# Patient Record
Sex: Male | Born: 1960 | Race: White | Hispanic: No | Marital: Married | Smoking: Never smoker
Health system: Southern US, Community
[De-identification: ages and names within clinical notes are randomized; demographics above are authoritative.]

## PROBLEM LIST (undated history)

## (undated) DIAGNOSIS — S83232A Complex tear of medial meniscus, current injury, left knee, initial encounter: Secondary | ICD-10-CM

## (undated) DIAGNOSIS — M1990 Unspecified osteoarthritis, unspecified site: Secondary | ICD-10-CM

## (undated) DIAGNOSIS — Z9841 Cataract extraction status, right eye: Secondary | ICD-10-CM

## (undated) DIAGNOSIS — Z9842 Cataract extraction status, left eye: Secondary | ICD-10-CM

## (undated) DIAGNOSIS — I350 Nonrheumatic aortic (valve) stenosis: Secondary | ICD-10-CM

## (undated) DIAGNOSIS — I5189 Other ill-defined heart diseases: Secondary | ICD-10-CM

## (undated) DIAGNOSIS — I10 Essential (primary) hypertension: Secondary | ICD-10-CM

## (undated) DIAGNOSIS — M109 Gout, unspecified: Secondary | ICD-10-CM

## (undated) DIAGNOSIS — K219 Gastro-esophageal reflux disease without esophagitis: Secondary | ICD-10-CM

## (undated) DIAGNOSIS — E785 Hyperlipidemia, unspecified: Secondary | ICD-10-CM

## (undated) DIAGNOSIS — R011 Cardiac murmur, unspecified: Secondary | ICD-10-CM

## (undated) DIAGNOSIS — I4510 Unspecified right bundle-branch block: Secondary | ICD-10-CM

## (undated) HISTORY — PX: COLONOSCOPY: SHX174

## (undated) HISTORY — PX: UPPER GI ENDOSCOPY: SHX6162

---

## 2008-02-21 ENCOUNTER — Other Ambulatory Visit: Payer: Self-pay

## 2008-02-21 ENCOUNTER — Emergency Department: Payer: Self-pay | Admitting: Emergency Medicine

## 2009-11-01 ENCOUNTER — Emergency Department: Payer: Self-pay | Admitting: Emergency Medicine

## 2011-03-09 ENCOUNTER — Ambulatory Visit: Payer: Self-pay | Admitting: Emergency Medicine

## 2011-12-16 ENCOUNTER — Emergency Department: Payer: Self-pay | Admitting: *Deleted

## 2013-03-02 IMAGING — CR DG CHEST 2V
1 series · 2 of 2 positions shown · non-contrast
Comparison: none

REASON FOR EXAM: chest pain
COMMENTS:

PROCEDURE:     DXR - DXR CHEST PA (OR AP) AND LATERAL  - March 09, 2011 [DATE]
RESULT:     Comparison is made to the prior exam 02/21/2008. The lung fields
are clear. The heart, mediastinal and osseous structures show no significant
abnormalities.

[Series 1: view not recorded · 0.17mm/px · 2 of 2 slices shown]
[im 1/2]
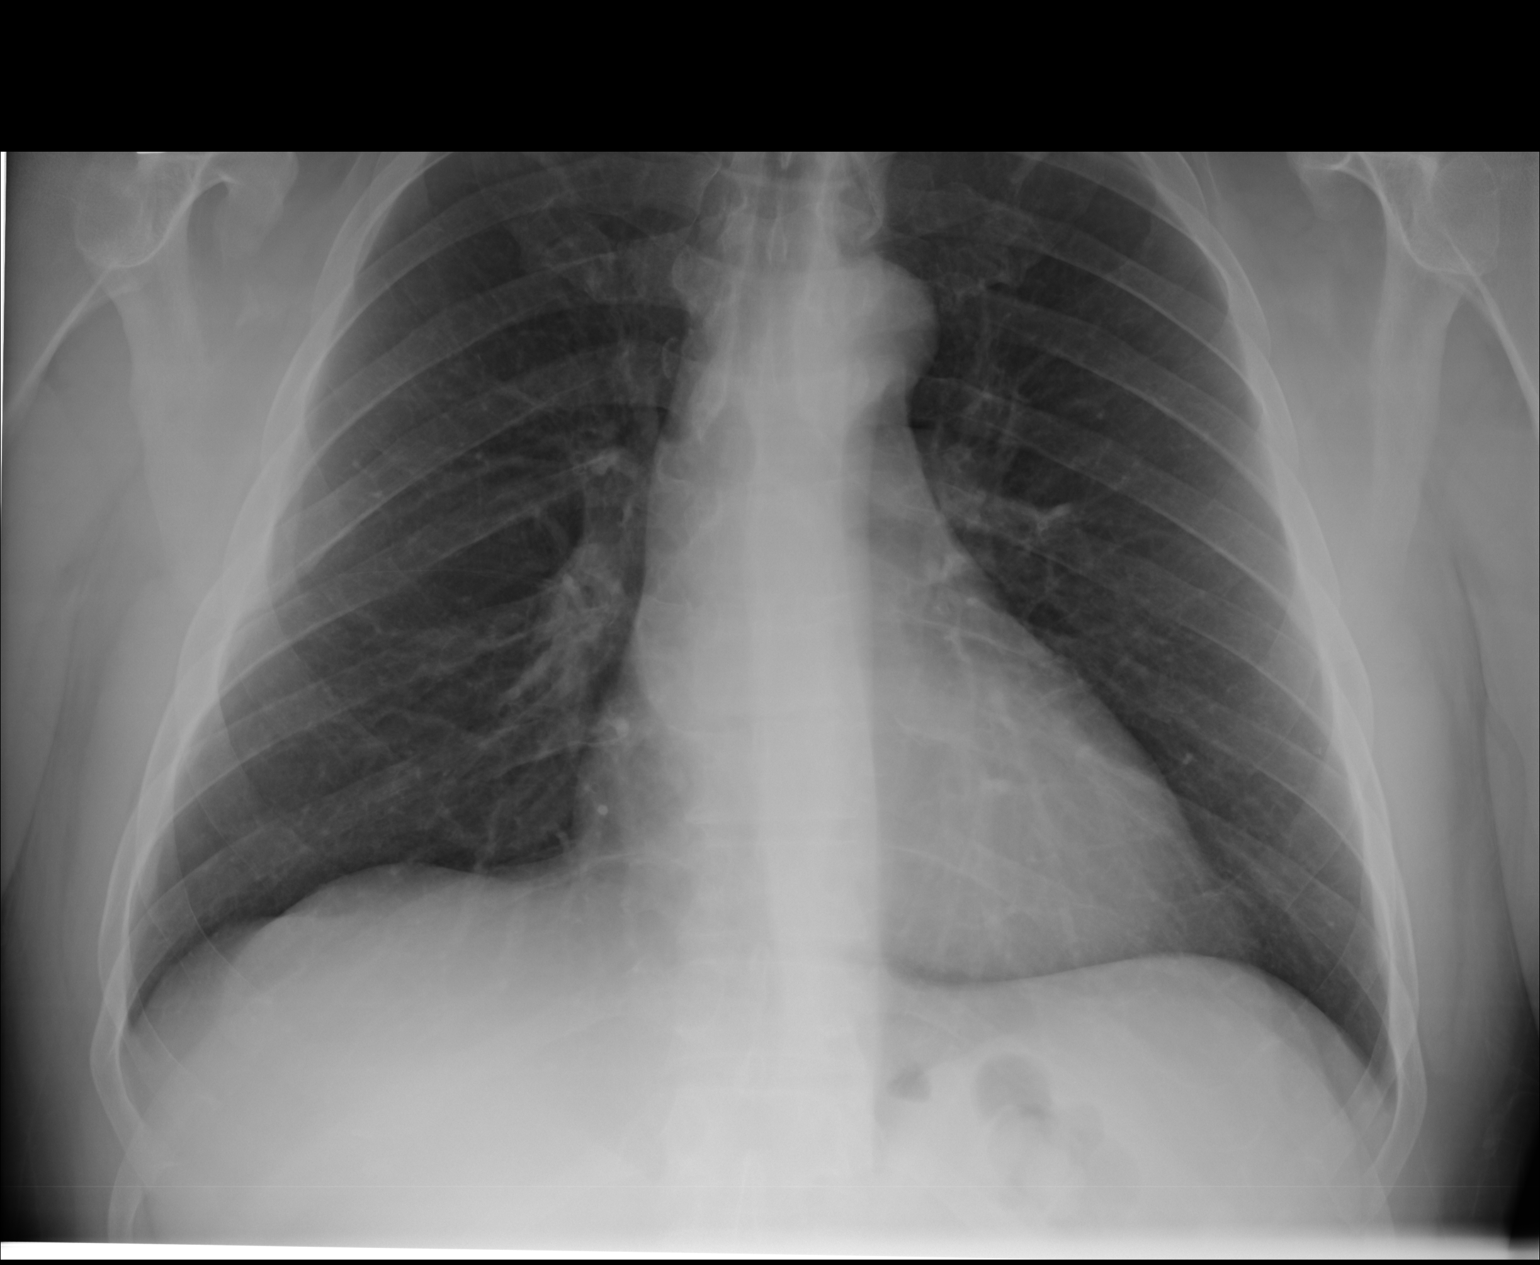
[im 2/2]
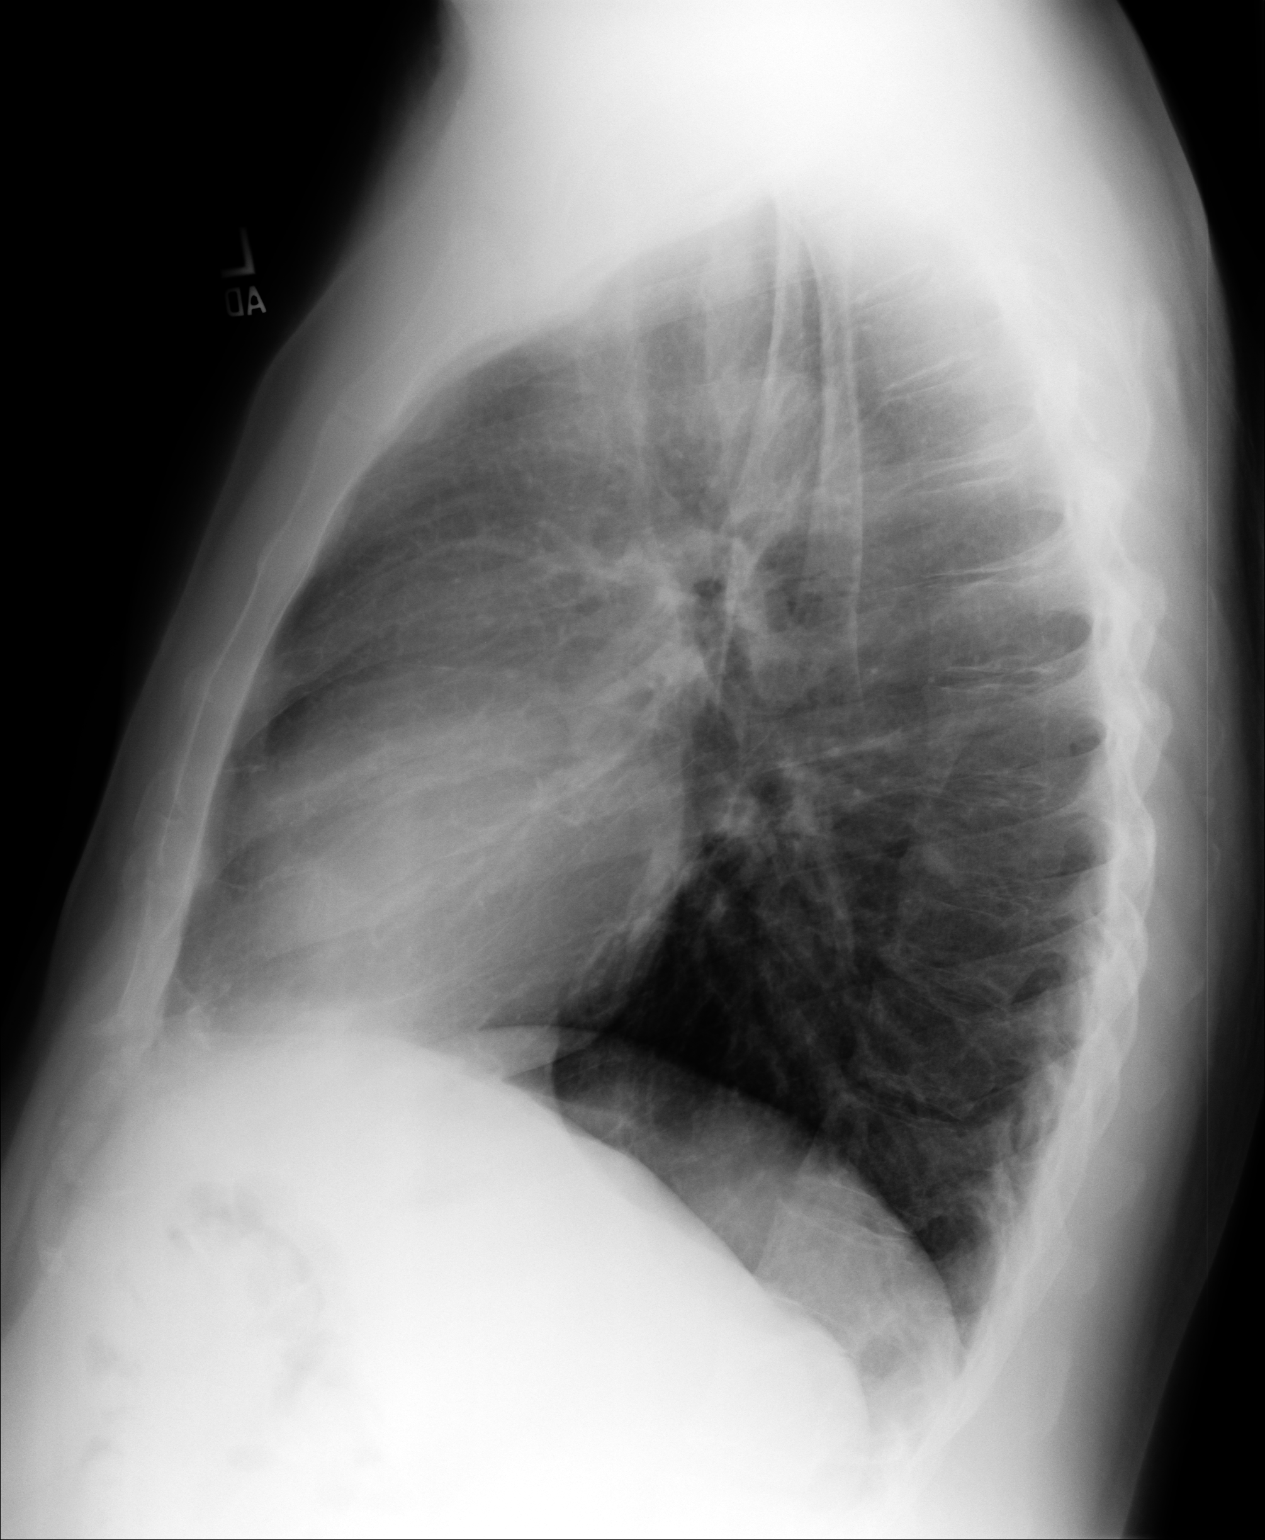

[2 of 2 positions shown; findings below may reference images not displayed]

IMPRESSION: No significant abnormalities are noted.

## 2013-05-20 ENCOUNTER — Emergency Department: Payer: Self-pay | Admitting: Emergency Medicine

## 2014-06-18 IMAGING — CR DG HAND COMPLETE 3+V*L*
1 series · 3 of 3 positions shown · non-contrast
Comparison: none

REASON FOR EXAM: injury
COMMENTS:   LMP: (Male)

[Series 1: x hand pa left · 0.14mm/px · 3 of 3 slices shown]
[im 1/3]
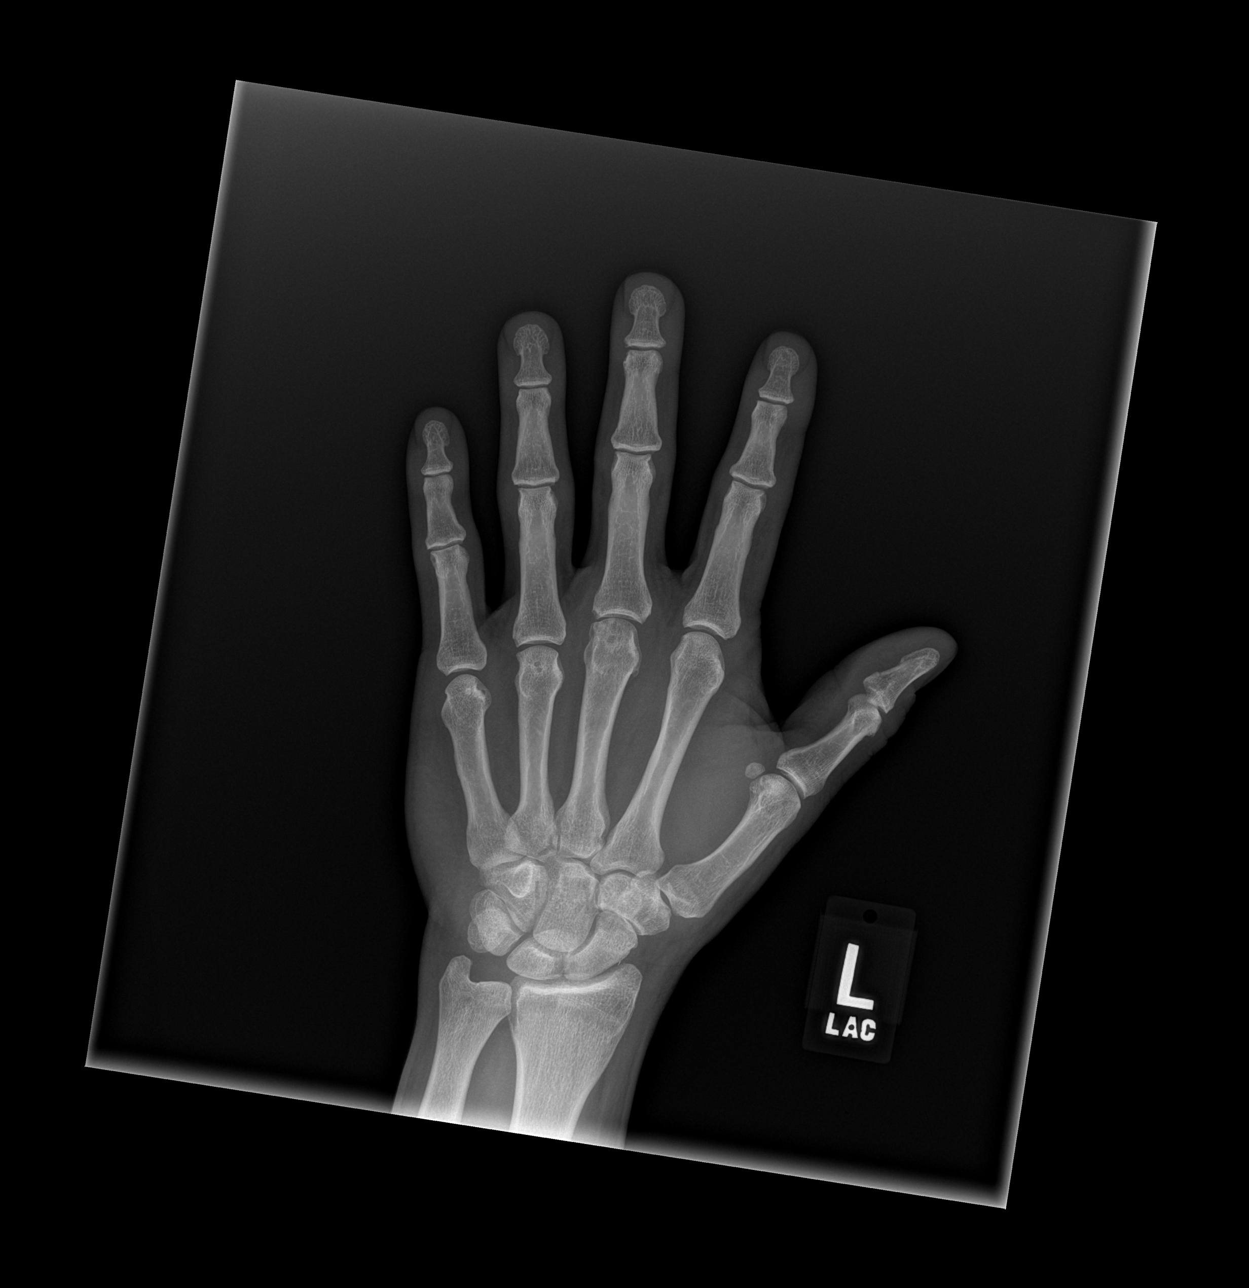
[im 2/3]
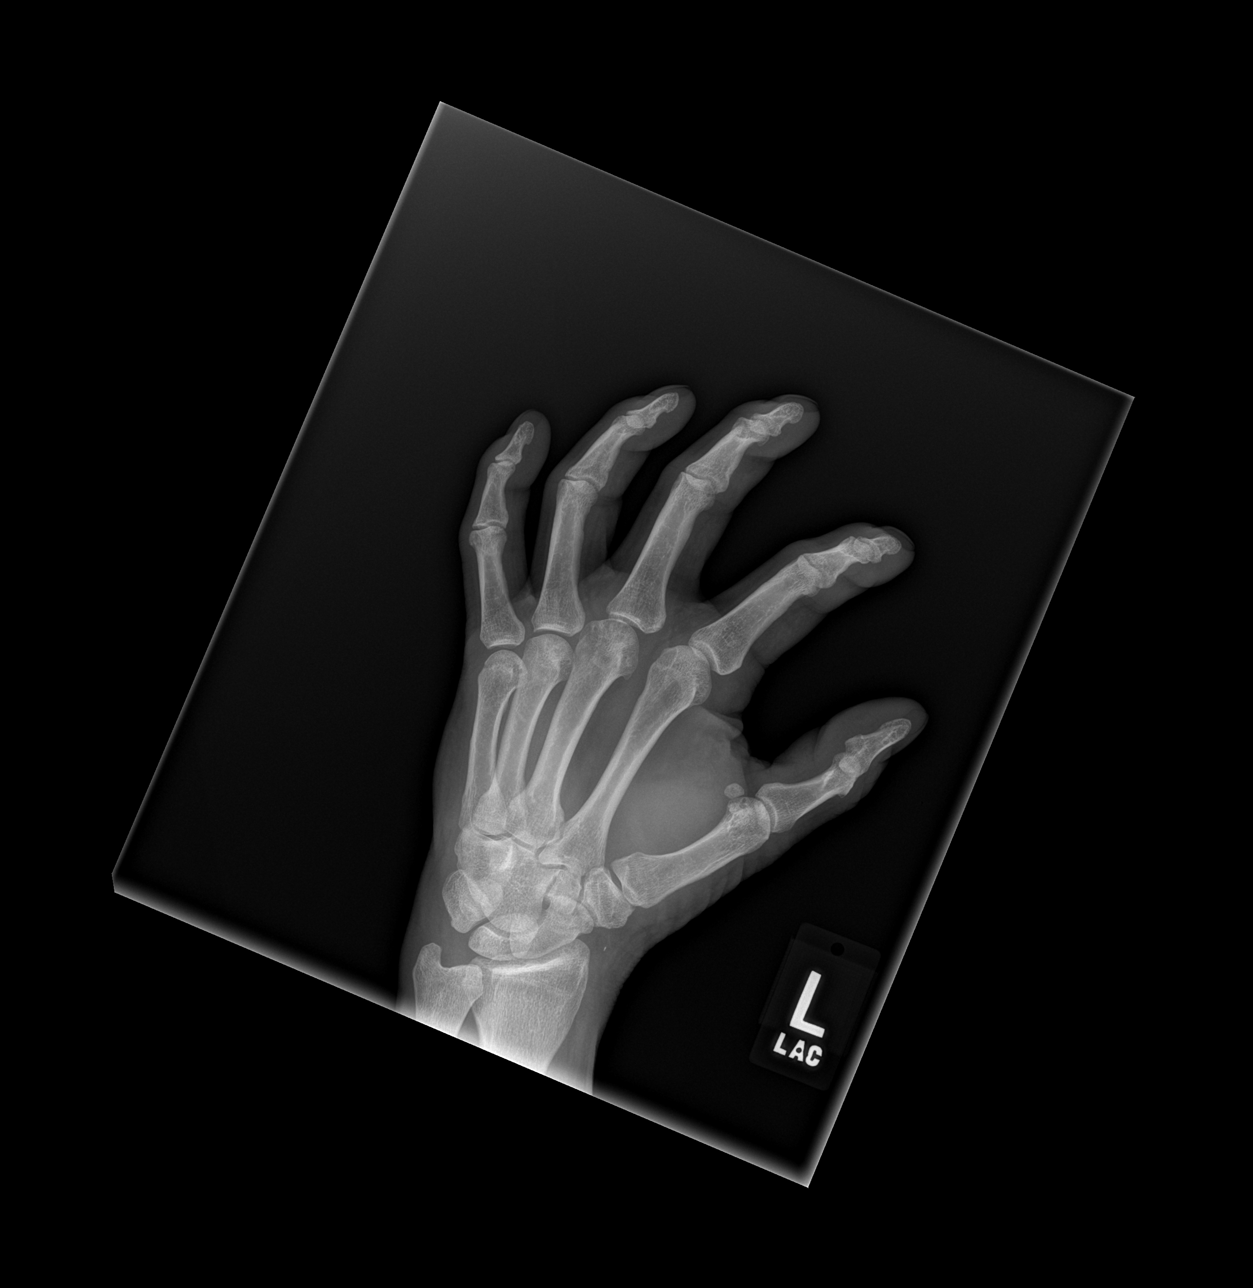
[im 3/3]
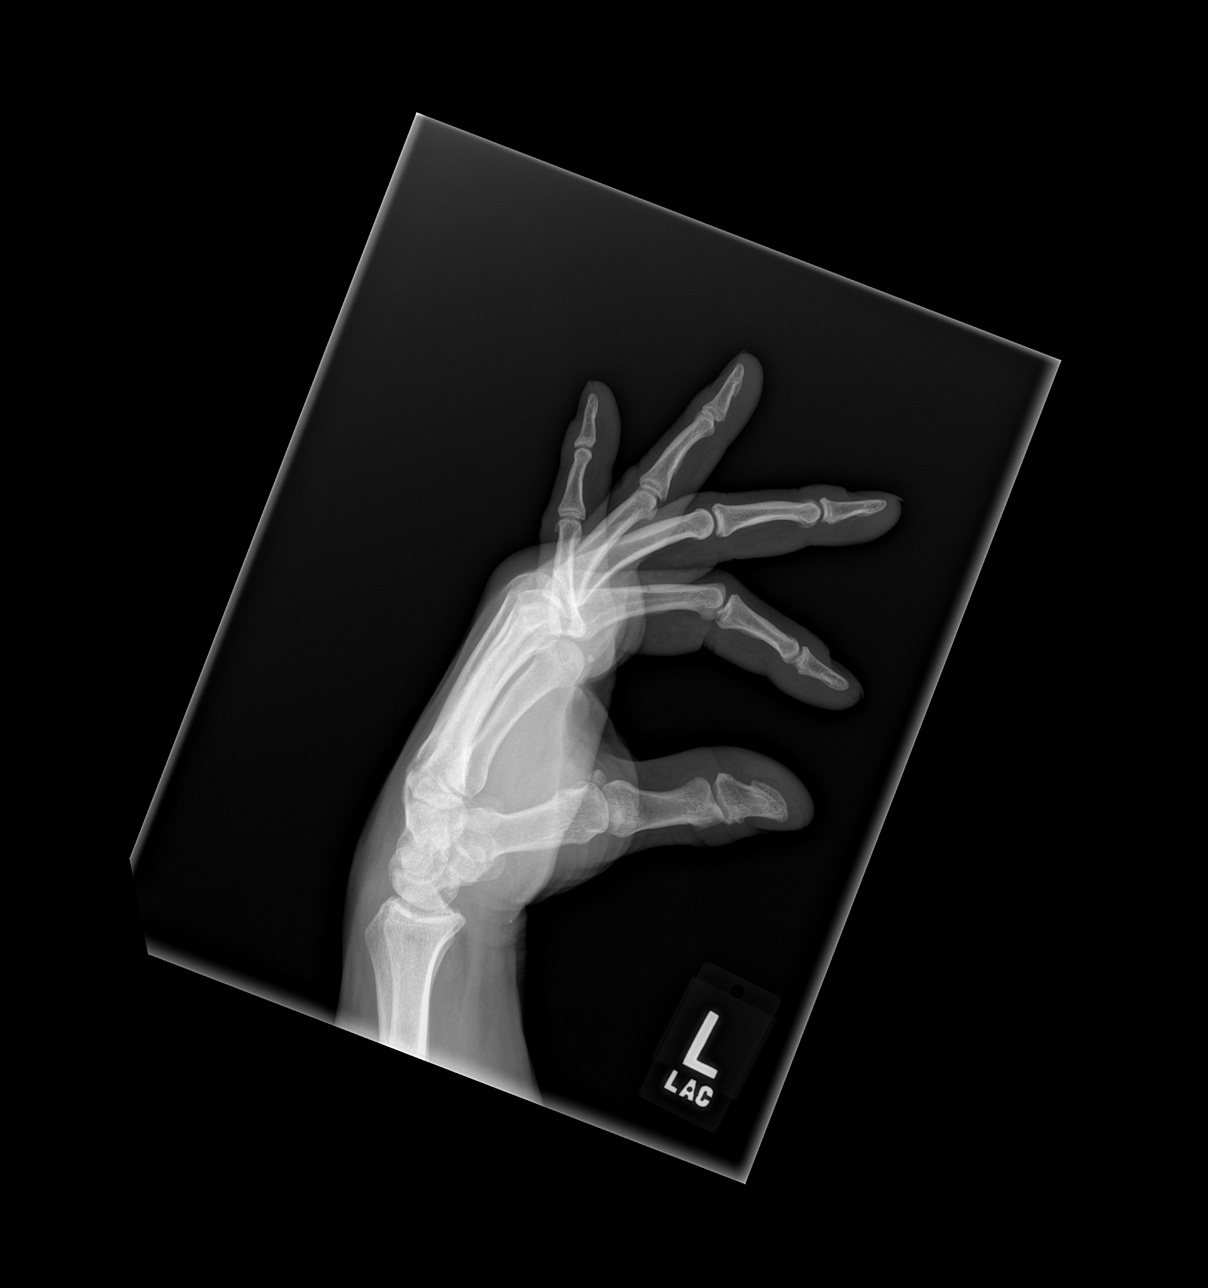

[3 of 3 positions shown; findings below may reference images not displayed]

PROCEDURE:     DXR - DXR HAND LT COMPLETE  W/OBLIQUES  - December 16, 2011  [DATE]

RESULT:     Three views of the left hand reveal the bones to be adequately
mineralized. I do not see evidence of an acute fracture nor dislocation.
There is no periosteal reaction. There is no evidence of soft tissue gas.
Mild soft tissue swelling is noted dorsally over the metacarpal regions on
the lateral film.
IMPRESSION: I do not see acute bony abnormality of the left hand. If
the patient's symptoms persist and remain unexplained, further evaluation
with nuclear bone scanning or with MRI may be useful.

[REDACTED]

## 2015-07-16 ENCOUNTER — Emergency Department
Admission: EM | Admit: 2015-07-16 | Discharge: 2015-07-16 | Disposition: A | Discharge status: Home / Self Care | Payer: Self-pay

## 2017-12-31 ENCOUNTER — Encounter: Payer: Self-pay | Admitting: Emergency Medicine

## 2017-12-31 ENCOUNTER — Emergency Department
Admission: EM | Admit: 2017-12-31 | Discharge: 2017-12-31 | Disposition: A | Discharge status: Home / Self Care | Payer: Self-pay | Attending: Emergency Medicine | Admitting: Emergency Medicine

## 2017-12-31 DIAGNOSIS — T18128A Food in esophagus causing other injury, initial encounter: Secondary | ICD-10-CM | POA: Insufficient documentation

## 2017-12-31 DIAGNOSIS — Y929 Unspecified place or not applicable: Secondary | ICD-10-CM | POA: Insufficient documentation

## 2017-12-31 DIAGNOSIS — Y999 Unspecified external cause status: Secondary | ICD-10-CM | POA: Insufficient documentation

## 2017-12-31 DIAGNOSIS — Y9389 Activity, other specified: Secondary | ICD-10-CM | POA: Insufficient documentation

## 2017-12-31 DIAGNOSIS — X58XXXA Exposure to other specified factors, initial encounter: Secondary | ICD-10-CM | POA: Insufficient documentation

## 2017-12-31 DIAGNOSIS — Z87891 Personal history of nicotine dependence: Secondary | ICD-10-CM | POA: Insufficient documentation

## 2017-12-31 DIAGNOSIS — K222 Esophageal obstruction: Secondary | ICD-10-CM | POA: Insufficient documentation

## 2017-12-31 DIAGNOSIS — Z79899 Other long term (current) drug therapy: Secondary | ICD-10-CM | POA: Insufficient documentation

## 2017-12-31 DIAGNOSIS — I10 Essential (primary) hypertension: Secondary | ICD-10-CM | POA: Insufficient documentation

## 2017-12-31 HISTORY — DX: Essential (primary) hypertension: I10

## 2017-12-31 MED ORDER — GLUCAGON HCL (RDNA) 1 MG IJ SOLR
1.0000 mg | Freq: Once | INTRAMUSCULAR | Status: AC
  Administered 2017-12-31: 1 mg via INTRAVENOUS
  Filled 2017-12-31 (×2): qty 1

## 2017-12-31 MED ORDER — AMLODIPINE BESYLATE 5 MG PO TABS: 5.0000 mg | ORAL_TABLET | Freq: Every day | ORAL | 1 refills | Status: DC

## 2017-12-31 MED ORDER — ONDANSETRON HCL 4 MG/2ML IJ SOLN
4.0000 mg | Freq: Once | INTRAMUSCULAR | Status: AC
  Administered 2017-12-31: 4 mg via INTRAVENOUS
  Filled 2017-12-31: qty 2

## 2017-12-31 MED ORDER — OMEPRAZOLE 40 MG PO CPDR: 40.0000 mg | DELAYED_RELEASE_CAPSULE | Freq: Every day | ORAL | 1 refills | Status: DC

## 2017-12-31 NOTE — ED Provider Notes (Signed)
[facility] Emergency Department Provider Note  ____________________________________________  Time seen: Approximately 10:10 AM  I have reviewed the triage vital signs and the nursing notes.   HISTORY  Chief Complaint Choking and Dysphagia   HPI Anthony Olsen is a 57 y.o. male with a history of hypertension who presents for evaluation of esophageal obstruction due to food bolus.  Patient reports that yesterday evening he had a steak.  He feels that the steak did not go down and still feeling pressure in the lower part of his esophagus.  The symptoms were sudden and acute in onset and constant since last night. Since last night has been trying to drink water and has vomited every time.  He is able to handle his saliva.  He denies shortness of breath, chest pain, abdominal pain.  Patient had 2 prior episodes of food impaction 1 which resolved with glucagon and the other one required an endoscopy.  Patient does not take any medications for GERD.  He he also reports noncompliance with his antihypertensive medication for several months.  He does not have a primary care doctor and ran out of prescriptions.  Past Medical History:  Diagnosis Date  . Hypertension      Prior to Admission medications   Medication Sig Start Date End Date Taking? Authorizing Provider  naproxen sodium (ALEVE) 220 MG tablet Take 220-440 mg by mouth daily as needed (arthritis pain).   Yes [provider]  amLODipine (NORVASC) 5 MG tablet Take 1 tablet (5 mg total) by mouth daily. 12/31/17 12/31/18  Nita Sickle, MD  omeprazole (PRILOSEC) 40 MG capsule Take 1 capsule (40 mg total) by mouth daily. 12/31/17 12/31/18  Nita Sickle, MD    Allergies Patient has no known allergies.  FH COPD Brother    Coronary Artery Disease (Blocked arteries around heart) Father    High blood pressure (Hypertension) Father    Breast cancer Mother      Social History Social  History   Tobacco Use  . Smoking status: Former Games developer  . Smokeless tobacco: Never Used  Substance Use Topics  . Alcohol use: Yes  . Drug use: Not on file    Review of Systems  Constitutional: Negative for fever. Eyes: Negative for visual changes. ENT: Negative for sore throat. Neck: No neck pain  Cardiovascular: Negative for chest pain. Respiratory: Negative for shortness of breath. Gastrointestinal: Negative for abdominal pain. + vomiting Genitourinary: Negative for dysuria. Musculoskeletal: Negative for back pain. Skin: Negative for rash. Neurological: Negative for headaches, weakness or numbness. Psych: No SI or HI  ____________________________________________   PHYSICAL EXAM:  VITAL SIGNS: ED Triage Vitals  Enc Vitals Group     BP 12/31/17 0832 (!) 161/86     Pulse Rate 12/31/17 0832 82     Resp 12/31/17 0832 20     Temp 12/31/17 0832 98.4 F (36.9 C)     Temp Source 12/31/17 0832 Oral     SpO2 12/31/17 0832 94 %     Weight 12/31/17 0833 240 lb (108.9 kg)     Height 12/31/17 0833 6\' 2"  (1.88 m)     Head Circumference --      Peak Flow --      Pain Score 12/31/17 0842 0     Pain Loc --      Pain Edu? --      Excl. in GC? --     Constitutional: Alert and oriented. Well appearing and in no apparent distress.  HEENT:      Head: Normocephalic and atraumatic.         Eyes: Conjunctivae are normal. Sclera is non-icteric.       Mouth/Throat: Mucous membranes are moist.       Neck: Supple with no signs of meningismus. Cardiovascular: Regular rate and rhythm. No murmurs, gallops, or rubs. 2+ symmetrical distal pulses are present in all extremities. No JVD. Respiratory: Normal respiratory effort. Lungs are clear to auscultation bilaterally. No wheezes, crackles, or rhonchi.  Gastrointestinal: Soft, non tender, and non distended with positive bowel sounds. No rebound or guarding. Musculoskeletal: Nontender with normal range of motion in all extremities. No edema,  cyanosis, or erythema of extremities. Neurologic: Normal speech and language. Face is symmetric. Moving all extremities. No gross focal neurologic deficits are appreciated. Skin: Skin is warm, dry and intact. No rash noted. Psychiatric: Mood and affect are normal. Speech and behavior are normal.  ____________________________________________   LABS (all labs ordered are listed, but only abnormal results are displayed)  Labs Reviewed - No data to display ____________________________________________  EKG  none  __________________________________________  RADIOLOGY  none  ____________________________________________   PROCEDURES  Procedure(s) performed: None Procedures Critical Care performed:  None ____________________________________________   INITIAL IMPRESSION / ASSESSMENT AND PLAN / ED COURSE  57 y.o. male with a history of hypertension who presents for evaluation of esophageal obstruction due to food bolus since last night.  Patient is vomiting every time he tries to drink anything.  He is handling his saliva, no respiratory distress.  Patient was given IV Zofran and glucagon and 45 minutes later reports that he felt the fluid bolus moving down and he was able to tolerate liquids and solids without any further vomiting.  Will start patient on omeprazole for GERD.  Patient is requesting prescription for his antihypertensive medication which she has been out of for several months.  Patient was supposed to be on lisinopril however due possible side effects of angioedema I have suggested switching patient to amlodipine which he accepted.  Will provide him with a prescription for that.  [facility].  Discussed return precautions for any further episodes of food impaction, difficulty breathing, vomiting, or abdominal pain.  At this time patient is stable for discharge.      As part of my medical decision making, I reviewed the  following data within the electronic MEDICAL RECORD NUMBER Nursing notes reviewed and incorporated, Old chart reviewed, Notes from prior ED visits and [place] Controlled Substance Database    Pertinent labs & imaging results that were available during my care of the patient were reviewed by me and considered in my medical decision making (see chart for details).    ____________________________________________   FINAL CLINICAL IMPRESSION(S) / ED DIAGNOSES  Final diagnoses:  Esophageal obstruction due to food impaction      NEW MEDICATIONS STARTED DURING THIS VISIT:  ED Discharge Orders        Ordered    omeprazole (PRILOSEC) 40 MG capsule  Daily     12/31/17 1117    amLODipine (NORVASC) 5 MG tablet  Daily     12/31/17 1117       Note:  This document was prepared using Dragon voice recognition software and may include unintentional dictation errors.    Don PerkingVeronese, [place], MD 12/31/17 1124

## 2017-12-31 NOTE — ED Notes (Signed)
First Nurse Note: Patient states he has a piece of steak stuck in his esophagus since 2230 last PM.  Has had this previously appox. 1 year ago.  Alert and oriented.  NAD.

## 2017-12-31 NOTE — ED Notes (Signed)
Pt attempted to drink sprite. Pt was unable to keep it down.

## 2017-12-31 NOTE — ED Notes (Signed)
Pt placed on monitor.  Pt states he ate steak last night and felt like the food became stuck in his throat reports he is unable to swallow his own saliva and can not swallow liquids.  Pt appears in no acute resp distress at this time.  Pt alert and oriented with family at bedside.

## 2017-12-31 NOTE — Discharge Instructions (Signed)
Please return to the emergency room if you are having any further difficulties eating, chest pain, abdominal pain.  Otherwise follow-up with your primary care doctor.

## 2017-12-31 NOTE — ED Triage Notes (Signed)
Patient presents to the ED stating, "I've got a piece of steak stuck in my throat."  Patient reports similar occurrence a few times in the past.  Patient states, "I had a glucose shot before and that fixed it.  One time they had to go in and remove it."  Patient states he is having difficulty drinking water.  Patient states he felt like he was choking prior to feeling like food was stuck.

## 2018-06-29 HISTORY — PX: BRONCHOSCOPIC REMOVAL OF FOREIGN BODY: SHX5171

## 2018-10-28 DIAGNOSIS — U071 COVID-19: Secondary | ICD-10-CM

## 2018-10-28 HISTORY — DX: COVID-19: U07.1

## 2018-11-01 ENCOUNTER — Ambulatory Visit: Payer: Self-pay | Admitting: General Practice

## 2018-11-01 NOTE — Telephone Encounter (Signed)
Pt and wife, Anthony Olsen on speaker phone called in.   Pt has been exposed to co-workers who are positive for the COVID-19 virus.   He now has all the symptoms now except difficulty breathing.   See triage notes.  He is not established with a PCP so I referred him to the [facility] ED since that is where they are directing the COVID-19 pts.  I let him know that I would call ahead and let them know you were coming in and give them your name.  Pt and wife were agreeable to this plan.  [facility] and [facility]   I gave her the pt' name and symptoms.   She thanked me for the heads up.    Reason for Disposition . [1] Fever (or feeling feverish) OR cough occurs AND [2] living in area with major community spread AND [3] testing being done in the community for symptoms  Answer Assessment - Initial Assessment Questions 1. CLOSE CONTACT: "Who is the person with the confirmed or suspected COVID-19 infection that you were exposed to?"     He worked with several co-workers who had COVID-19.   Coughing, fever, chills, loss of taste, tired, weakness.   No difficulty breathing.   Had fever.  101.8 at the highest.   He takes Aleve for arthritis so it's bringing the fever down.  Wife is answering questions but he is in the background.. 2. PLACE of CONTACT: "Where were you when you were exposed to COVID-19?" (e.g., home, school, medical waiting room; which city?)     Work. 3. TYPE of CONTACT: "How much contact was there?" (e.g., sitting next to, live in same house, work in same office, same building)     Spoke with a co-worker that has died. 4. DURATION of CONTACT: "How long were you in contact with the COVID-19 patient?" (e.g., a few seconds, passed by person, a few minutes, live with the patient)     They had a company come in and clean the building due to the positive cases of COVID-19.   One co-worker is on the ventilator. 5. DATE of CONTACT: "When did you have contact with a  COVID-19 patient?" (e.g., how many days ago)     Had contact with person that was positive a month ago.   The company is not telling us who it is.   They are monitoring his temperature.   But not put out of work until fever is 104.     101.8 his fever. 6. TRAVEL: "Have you traveled out of the country recently?" If so, "When and where?"     * Also ask about out-of-state travel, since the CDC has identified some high risk cities for community spread in the [place].     * Note: Travel becomes less relevant if there is widespread community transmission where the patient lives.     No travels. 7. COMMUNITY SPREAD: "Are there lots of cases of COVID-19 (community spread) where you live?" (See public health department website, if unsure)   * MAJOR community spread: high number of cases; numbers of cases are increasing; many people hospitalized.   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   8. SYMPTOMS: "Do you have any symptoms?" (e.g., fever, cough, breathing difficulty)     See above. 9. PREGNANCY OR POSTPARTUM: "Is  there any chance you are pregnant?" "When was your last menstrual period?" "Did you deliver in the last 2 weeks?"     N/A 10. HIGH RISK: "Do you have any heart or lung problems? Do you have a weak immune system?" (e.g., CHF, COPD, asthma, HIV positive, chemotherapy, renal failure, diabetes mellitus, sickle cell anemia)       No.  Protocols used: CORONAVIRUS (COVID-19) EXPOSURE-A-AH

## 2018-11-02 DIAGNOSIS — Z8616 Personal history of COVID-19: Secondary | ICD-10-CM

## 2018-11-02 HISTORY — DX: Personal history of COVID-19: Z86.16

## 2019-10-09 ENCOUNTER — Ambulatory Visit: Admit: 2019-10-09 | Payer: Self-pay | Admitting: Ophthalmology

## 2019-10-09 SURGERY — PHACOEMULSIFICATION, CATARACT, WITH IOL INSERTION
Anesthesia: Topical | Laterality: Left

## 2020-02-06 ENCOUNTER — Encounter: Payer: Self-pay | Admitting: Ophthalmology

## 2020-02-06 ENCOUNTER — Other Ambulatory Visit: Payer: Self-pay

## 2020-02-08 ENCOUNTER — Other Ambulatory Visit
Admission: RE | Admit: 2020-02-08 | Discharge: 2020-02-08 | Disposition: A | Discharge status: Home / Self Care | Payer: Managed Care, Other (non HMO) | Source: Ambulatory Visit | Attending: Ophthalmology | Admitting: Ophthalmology

## 2020-02-08 ENCOUNTER — Other Ambulatory Visit: Payer: Self-pay

## 2020-02-08 DIAGNOSIS — Z01812 Encounter for preprocedural laboratory examination: Secondary | ICD-10-CM | POA: Insufficient documentation

## 2020-02-08 DIAGNOSIS — Z20822 Contact with and (suspected) exposure to covid-19: Secondary | ICD-10-CM | POA: Diagnosis not present

## 2020-02-08 LAB — SARS CORONAVIRUS 2 (TAT 6-24 HRS): SARS Coronavirus 2: NEGATIVE

## 2020-02-08 NOTE — Discharge Instructions (Signed)

## 2020-02-09 NOTE — Anesthesia Preprocedure Evaluation (Addendum)
Anesthesia Evaluation  Patient identified by MRN, date of birth, ID band Patient awake    Reviewed: Allergy & Precautions, NPO status , Patient's Chart, lab work & pertinent test results, reviewed documented beta blocker date and time   History of Anesthesia Complications Negative for: history of anesthetic complications  Airway Mallampati: II  TM Distance: >3 FB Neck ROM: Full    Dental   Pulmonary former smoker,    breath sounds clear to auscultation       Cardiovascular hypertension, (-) angina(-) DOE  Rhythm:Regular Rate:Normal     Neuro/Psych    GI/Hepatic neg GERD  ,  Endo/Other    Renal/GU      Musculoskeletal  (+) Arthritis ,   Abdominal (+) + obese (BMI 34),   Peds  Hematology   Anesthesia Other Findings Gout  Reproductive/Obstetrics                            Anesthesia Physical Anesthesia Plan  ASA: II  Anesthesia Plan: MAC   Post-op Pain Management:    Induction: Intravenous  PONV Risk Score and Plan: 1 and TIVA, Midazolam and Treatment may vary due to age or medical condition  Airway Management Planned: Nasal Cannula  Additional Equipment:   Intra-op Plan:   Post-operative Plan:   Informed Consent: I have reviewed the patients History and Physical, chart, labs and discussed the procedure including the risks, benefits and alternatives for the proposed anesthesia with the patient or authorized representative who has indicated his/her understanding and acceptance.       Plan Discussed with: CRNA and Anesthesiologist  Anesthesia Plan Comments:         Anesthesia Quick Evaluation

## 2020-02-12 ENCOUNTER — Other Ambulatory Visit: Payer: Self-pay

## 2020-02-12 ENCOUNTER — Ambulatory Visit: Payer: Managed Care, Other (non HMO) | Admitting: Anesthesiology

## 2020-02-12 ENCOUNTER — Encounter
Admission: RE | Disposition: A | Discharge status: Home / Self Care | Payer: Self-pay | Source: Home / Self Care | Attending: Ophthalmology

## 2020-02-12 ENCOUNTER — Ambulatory Visit
Admission: RE | Admit: 2020-02-12 | Discharge: 2020-02-12 | Disposition: A | Discharge status: Home / Self Care | Payer: Managed Care, Other (non HMO) | Attending: Ophthalmology | Admitting: Ophthalmology

## 2020-02-12 ENCOUNTER — Encounter: Payer: Self-pay | Admitting: Ophthalmology

## 2020-02-12 DIAGNOSIS — Z87891 Personal history of nicotine dependence: Secondary | ICD-10-CM | POA: Diagnosis not present

## 2020-02-12 DIAGNOSIS — Z8616 Personal history of COVID-19: Secondary | ICD-10-CM | POA: Diagnosis not present

## 2020-02-12 DIAGNOSIS — H2512 Age-related nuclear cataract, left eye: Secondary | ICD-10-CM | POA: Insufficient documentation

## 2020-02-12 DIAGNOSIS — I10 Essential (primary) hypertension: Secondary | ICD-10-CM | POA: Insufficient documentation

## 2020-02-12 HISTORY — DX: Unspecified osteoarthritis, unspecified site: M19.90

## 2020-02-12 HISTORY — PX: CATARACT EXTRACTION W/PHACO: SHX586

## 2020-02-12 HISTORY — DX: Gout, unspecified: M10.9

## 2020-02-12 SURGERY — PHACOEMULSIFICATION, CATARACT, WITH IOL INSERTION
Anesthesia: Monitor Anesthesia Care | Site: Eye | Laterality: Left

## 2020-02-12 MED ORDER — FENTANYL CITRATE (PF) 100 MCG/2ML IJ SOLN
INTRAMUSCULAR | Status: DC | PRN
  Administered 2020-02-12: 50 ug via INTRAVENOUS

## 2020-02-12 MED ORDER — LACTATED RINGERS IV SOLN: INTRAVENOUS | Status: DC

## 2020-02-12 MED ORDER — TETRACAINE HCL 0.5 % OP SOLN
1.0000 [drp] | OPHTHALMIC | Status: DC | PRN
  Administered 2020-02-12 (×3): 1 [drp] via OPHTHALMIC

## 2020-02-12 MED ORDER — ACETAMINOPHEN 10 MG/ML IV SOLN: 1000.0000 mg | Freq: Once | INTRAVENOUS | Status: DC | PRN

## 2020-02-12 MED ORDER — ONDANSETRON HCL 4 MG/2ML IJ SOLN: 4.0000 mg | Freq: Once | INTRAMUSCULAR | Status: DC | PRN

## 2020-02-12 MED ORDER — MIDAZOLAM HCL 2 MG/2ML IJ SOLN
INTRAMUSCULAR | Status: DC | PRN
  Administered 2020-02-12: 2 mg via INTRAVENOUS

## 2020-02-12 MED ORDER — LIDOCAINE HCL (PF) 2 % IJ SOLN
INTRAOCULAR | Status: DC | PRN
  Administered 2020-02-12: 1 mL via INTRAOCULAR

## 2020-02-12 MED ORDER — SODIUM HYALURONATE 23 MG/ML IO SOLN
INTRAOCULAR | Status: DC | PRN
  Administered 2020-02-12: 0.6 mL via INTRAOCULAR

## 2020-02-12 MED ORDER — EPINEPHRINE PF 1 MG/ML IJ SOLN
INTRAOCULAR | Status: DC | PRN
  Administered 2020-02-12: 67 mL via OPHTHALMIC

## 2020-02-12 MED ORDER — MOXIFLOXACIN HCL 0.5 % OP SOLN
OPHTHALMIC | Status: DC | PRN
  Administered 2020-02-12: 0.2 mL via OPHTHALMIC

## 2020-02-12 MED ORDER — SODIUM HYALURONATE 10 MG/ML IO SOLN
INTRAOCULAR | Status: DC | PRN
  Administered 2020-02-12: 0.55 mL via INTRAOCULAR

## 2020-02-12 MED ORDER — ARMC OPHTHALMIC DILATING DROPS
1.0000 "application " | OPHTHALMIC | Status: DC | PRN
  Administered 2020-02-12 (×3): 1 via OPHTHALMIC

## 2020-02-12 SURGICAL SUPPLY — 20 items
CANNULA ANT/CHMB 27G (MISCELLANEOUS) ×2 IMPLANT
CANNULA ANT/CHMB 27GA (MISCELLANEOUS) ×6 IMPLANT
DISSECTOR HYDRO NUCLEUS 50X22 (MISCELLANEOUS) ×3 IMPLANT
GLOVE SURG LX 7.5 STRW (GLOVE) ×4
GLOVE SURG LX STRL 7.5 STRW (GLOVE) ×1 IMPLANT
GLOVE SURG SYN 8.5  E (GLOVE) ×2
GLOVE SURG SYN 8.5 E (GLOVE) ×1 IMPLANT
GLOVE SURG SYN 8.5 PF PI (GLOVE) ×1 IMPLANT
GOWN STRL REUS W/ TWL LRG LVL3 (GOWN DISPOSABLE) ×2 IMPLANT
GOWN STRL REUS W/TWL LRG LVL3 (GOWN DISPOSABLE) ×6
LENS IOL DIOP 17.0 (Intraocular Lens) ×3 IMPLANT
LENS IOL TECNIS MONO 17.0 (Intraocular Lens) IMPLANT
MARKER SKIN DUAL TIP RULER LAB (MISCELLANEOUS) ×3 IMPLANT
PACK DR. KING ARMS (PACKS) ×3 IMPLANT
PACK EYE AFTER SURG (MISCELLANEOUS) ×3 IMPLANT
PACK OPTHALMIC (MISCELLANEOUS) ×3 IMPLANT
SYR 3ML LL SCALE MARK (SYRINGE) ×3 IMPLANT
SYR TB 1ML LUER SLIP (SYRINGE) ×3 IMPLANT
WATER STERILE IRR 250ML POUR (IV SOLUTION) ×3 IMPLANT
WIPE NON LINTING 3.25X3.25 (MISCELLANEOUS) ×3 IMPLANT

## 2020-02-12 NOTE — H&P (Signed)

## 2020-02-12 NOTE — Anesthesia Postprocedure Evaluation (Signed)
Anesthesia Post Note  Patient: Anthony Olsen  Procedure(s) Performed: CATARACT EXTRACTION PHACO AND INTRAOCULAR LENS PLACEMENT (IOC) LEFT (Left Eye)     Patient location during evaluation: PACU Anesthesia Type: MAC Level of consciousness: awake and alert Pain management: pain level controlled Vital Signs Assessment: post-procedure vital signs reviewed and stable Respiratory status: spontaneous breathing, nonlabored ventilation, respiratory function stable and patient connected to nasal cannula oxygen Cardiovascular status: stable and blood pressure returned to baseline Postop Assessment: no apparent nausea or vomiting Anesthetic complications: no   No complications documented.  Johnmichael Melhorn A  Almas Rake

## 2020-02-12 NOTE — Transfer of Care (Signed)
Immediate Anesthesia Transfer of Care Note  Patient: Anthony Olsen  Procedure(s) Performed: CATARACT EXTRACTION PHACO AND INTRAOCULAR LENS PLACEMENT (IOC) LEFT (Left Eye)  Patient Location: PACU  Anesthesia Type: MAC  Level of Consciousness: awake, alert  and patient cooperative  Airway and Oxygen Therapy: Patient Spontanous Breathing and Patient connected to supplemental oxygen  Post-op Assessment: Post-op Vital signs reviewed, Patient's Cardiovascular Status Stable, Respiratory Function Stable, Patent Airway and No signs of Nausea or vomiting  Post-op Vital Signs: Reviewed and stable  Complications: No complications documented.

## 2020-02-12 NOTE — Anesthesia Procedure Notes (Signed)
Procedure Name: MAC Date/Time: 02/12/2020 10:42 AM Performed by: Silvana Newness, CRNA Pre-anesthesia Checklist: Patient identified, Emergency Drugs available, Suction available, Patient being monitored and Timeout performed Patient Re-evaluated:Patient Re-evaluated prior to induction Oxygen Delivery Method: Nasal cannula Placement Confirmation: positive ETCO2

## 2020-02-12 NOTE — Op Note (Signed)
OPERATIVE NOTE  Anthony Olsen [id number] 02/12/2020   PREOPERATIVE DIAGNOSIS:  Nuclear sclerotic cataract left eye.  H25.12   POSTOPERATIVE DIAGNOSIS:    Nuclear sclerotic cataract left eye.     PROCEDURE:  Phacoemusification with posterior chamber intraocular lens placement of the left eye   LENS:   Implant Name Type Inv. Item Serial No. Manufacturer Lot No. LRB No. Used Action  TECNIS SIMPLICITY IOL Intraocular Lens  [id number] [employer]  Left 1 Implanted      Procedure(s) with comments: CATARACT EXTRACTION PHACO AND INTRAOCULAR LENS PLACEMENT (IOC) LEFT (Left) - 4.63 0:30.5  DCB00 +17.0   ULTRASOUND TIME: 0 minutes 30 seconds.  CDE 4.63   SURGEON:  Willey Blade, MD, MPH   ANESTHESIA:  Topical with tetracaine drops augmented with 1% preservative-free intracameral lidocaine.  ESTIMATED BLOOD LOSS: <1 mL   COMPLICATIONS:  None.   DESCRIPTION OF PROCEDURE:  The patient was identified in the holding room and transported to the operating room and placed in the supine position under the operating microscope.  The left eye was identified as the operative eye and it was prepped and draped in the usual sterile ophthalmic fashion.   A 1.0 millimeter clear-corneal paracentesis was made at the 5:00 position. 0.5 ml of preservative-free 1% lidocaine with epinephrine was injected into the anterior chamber.  The anterior chamber was filled with Healon 5 viscoelastic.  A 2.4 millimeter keratome was used to make a near-clear corneal incision at the 2:00 position.  A curvilinear capsulorrhexis was made with a cystotome and capsulorrhexis forceps.  Balanced salt solution was used to hydrodissect and hydrodelineate the nucleus.   Phacoemulsification was then used in stop and chop fashion to remove the lens nucleus and epinucleus.  The remaining cortex was then removed using the irrigation and aspiration handpiece. Healon was then placed into the capsular bag to distend it for lens  placement.  A lens was then injected into the capsular bag.  The remaining viscoelastic was aspirated.   Wounds were hydrated with balanced salt solution.  The anterior chamber was inflated to a physiologic pressure with balanced salt solution.  Intracameral vigamox 0.1 mL undiltued was injected into the eye and a drop placed onto the ocular surface.  No wound leaks were noted.  The patient was taken to the recovery room in stable condition without complications of anesthesia or surgery  Willey Blade 02/12/2020, 10:58 AM

## 2020-02-13 ENCOUNTER — Encounter: Payer: Self-pay | Admitting: Ophthalmology

## 2020-04-09 ENCOUNTER — Encounter: Payer: Self-pay | Admitting: Ophthalmology

## 2020-04-09 ENCOUNTER — Other Ambulatory Visit: Payer: Self-pay

## 2020-04-09 NOTE — Anesthesia Preprocedure Evaluation (Addendum)
Anesthesia Evaluation  Patient identified by MRN, date of birth, ID band Patient awake    Reviewed: Allergy & Precautions, NPO status , Patient's Chart, lab work & pertinent test results, reviewed documented beta blocker date and time   History of Anesthesia Complications Negative for: history of anesthetic complications  Airway Mallampati: II  TM Distance: >3 FB Neck ROM: Full    Dental   Pulmonary former smoker,    breath sounds clear to auscultation       Cardiovascular hypertension, (-) angina(-) DOE  Rhythm:Regular Rate:Normal     Neuro/Psych    GI/Hepatic neg GERD  ,  Endo/Other    Renal/GU      Musculoskeletal  (+) Arthritis ,   Abdominal (+) + obese (BMI 33),   Peds  Hematology   Anesthesia Other Findings Gout  Reproductive/Obstetrics                             Anesthesia Physical  Anesthesia Plan  ASA: II  Anesthesia Plan: MAC   Post-op Pain Management:    Induction: Intravenous  PONV Risk Score and Plan: 1 and TIVA, Midazolam and Treatment may vary due to age or medical condition  Airway Management Planned: Nasal Cannula  Additional Equipment:   Intra-op Plan:   Post-operative Plan:   Informed Consent: I have reviewed the patients History and Physical, chart, labs and discussed the procedure including the risks, benefits and alternatives for the proposed anesthesia with the patient or authorized representative who has indicated his/her understanding and acceptance.       Plan Discussed with: CRNA and Anesthesiologist  Anesthesia Plan Comments:         Anesthesia Quick Evaluation

## 2020-04-11 ENCOUNTER — Other Ambulatory Visit: Payer: Self-pay

## 2020-04-11 ENCOUNTER — Other Ambulatory Visit
Admission: RE | Admit: 2020-04-11 | Discharge: 2020-04-11 | Disposition: A | Discharge status: Home / Self Care | Payer: Managed Care, Other (non HMO) | Source: Ambulatory Visit | Attending: Ophthalmology | Admitting: Ophthalmology

## 2020-04-11 DIAGNOSIS — Z01812 Encounter for preprocedural laboratory examination: Secondary | ICD-10-CM | POA: Insufficient documentation

## 2020-04-11 DIAGNOSIS — Z20822 Contact with and (suspected) exposure to covid-19: Secondary | ICD-10-CM | POA: Insufficient documentation

## 2020-04-11 LAB — SARS CORONAVIRUS 2 (TAT 6-24 HRS): SARS Coronavirus 2: NEGATIVE

## 2020-04-11 NOTE — Discharge Instructions (Signed)

## 2020-04-15 ENCOUNTER — Encounter
Admission: RE | Disposition: A | Discharge status: Home / Self Care | Payer: Self-pay | Source: Home / Self Care | Attending: Ophthalmology

## 2020-04-15 ENCOUNTER — Ambulatory Visit: Payer: Managed Care, Other (non HMO) | Admitting: Anesthesiology

## 2020-04-15 ENCOUNTER — Other Ambulatory Visit: Payer: Self-pay

## 2020-04-15 ENCOUNTER — Ambulatory Visit
Admission: RE | Admit: 2020-04-15 | Discharge: 2020-04-15 | Disposition: A | Discharge status: Home / Self Care | Payer: Managed Care, Other (non HMO) | Attending: Ophthalmology | Admitting: Ophthalmology

## 2020-04-15 ENCOUNTER — Encounter: Payer: Self-pay | Admitting: Ophthalmology

## 2020-04-15 DIAGNOSIS — H2511 Age-related nuclear cataract, right eye: Secondary | ICD-10-CM | POA: Diagnosis not present

## 2020-04-15 DIAGNOSIS — Z9842 Cataract extraction status, left eye: Secondary | ICD-10-CM | POA: Diagnosis not present

## 2020-04-15 DIAGNOSIS — M1990 Unspecified osteoarthritis, unspecified site: Secondary | ICD-10-CM | POA: Insufficient documentation

## 2020-04-15 DIAGNOSIS — Z87891 Personal history of nicotine dependence: Secondary | ICD-10-CM | POA: Insufficient documentation

## 2020-04-15 DIAGNOSIS — I10 Essential (primary) hypertension: Secondary | ICD-10-CM | POA: Insufficient documentation

## 2020-04-15 DIAGNOSIS — Z8616 Personal history of COVID-19: Secondary | ICD-10-CM | POA: Diagnosis not present

## 2020-04-15 HISTORY — PX: CATARACT EXTRACTION W/PHACO: SHX586

## 2020-04-15 SURGERY — PHACOEMULSIFICATION, CATARACT, WITH IOL INSERTION
Anesthesia: Monitor Anesthesia Care | Site: Eye | Laterality: Right

## 2020-04-15 MED ORDER — SODIUM HYALURONATE 10 MG/ML IO SOLN
INTRAOCULAR | Status: DC | PRN
  Administered 2020-04-15: 0.55 mL via INTRAOCULAR

## 2020-04-15 MED ORDER — TETRACAINE HCL 0.5 % OP SOLN
1.0000 [drp] | OPHTHALMIC | Status: DC | PRN
  Administered 2020-04-15 (×3): 1 [drp] via OPHTHALMIC

## 2020-04-15 MED ORDER — ONDANSETRON HCL 4 MG/2ML IJ SOLN: 4.0000 mg | Freq: Once | INTRAMUSCULAR | Status: DC | PRN

## 2020-04-15 MED ORDER — ACETAMINOPHEN 10 MG/ML IV SOLN: 1000.0000 mg | Freq: Once | INTRAVENOUS | Status: DC | PRN

## 2020-04-15 MED ORDER — LIDOCAINE HCL (PF) 2 % IJ SOLN
INTRAOCULAR | Status: DC | PRN
  Administered 2020-04-15: 1 mL via INTRAOCULAR

## 2020-04-15 MED ORDER — FENTANYL CITRATE (PF) 100 MCG/2ML IJ SOLN
INTRAMUSCULAR | Status: DC | PRN
  Administered 2020-04-15 (×2): 50 ug via INTRAVENOUS

## 2020-04-15 MED ORDER — MIDAZOLAM HCL 2 MG/2ML IJ SOLN
INTRAMUSCULAR | Status: DC | PRN
  Administered 2020-04-15: 2 mg via INTRAVENOUS

## 2020-04-15 MED ORDER — SODIUM HYALURONATE 23 MG/ML IO SOLN
INTRAOCULAR | Status: DC | PRN
  Administered 2020-04-15: 0.6 mL via INTRAOCULAR

## 2020-04-15 MED ORDER — MOXIFLOXACIN HCL 0.5 % OP SOLN
OPHTHALMIC | Status: DC | PRN
  Administered 2020-04-15: 0.2 mL via OPHTHALMIC

## 2020-04-15 MED ORDER — ARMC OPHTHALMIC DILATING DROPS
1.0000 "application " | OPHTHALMIC | Status: DC | PRN
  Administered 2020-04-15 (×3): 1 via OPHTHALMIC

## 2020-04-15 MED ORDER — EPINEPHRINE PF 1 MG/ML IJ SOLN
INTRAOCULAR | Status: DC | PRN
  Administered 2020-04-15: 77 mL via OPHTHALMIC

## 2020-04-15 MED ORDER — LACTATED RINGERS IV SOLN: INTRAVENOUS | Status: DC

## 2020-04-15 SURGICAL SUPPLY — 20 items
CANNULA ANT/CHMB 27G (MISCELLANEOUS) ×2 IMPLANT
CANNULA ANT/CHMB 27GA (MISCELLANEOUS) ×6 IMPLANT
DISSECTOR HYDRO NUCLEUS 50X22 (MISCELLANEOUS) ×3 IMPLANT
GLOVE SURG LX 7.5 STRW (GLOVE) ×4
GLOVE SURG LX STRL 7.5 STRW (GLOVE) ×1 IMPLANT
GLOVE SURG SYN 8.5  E (GLOVE) ×2
GLOVE SURG SYN 8.5 E (GLOVE) ×1 IMPLANT
GLOVE SURG SYN 8.5 PF PI (GLOVE) ×1 IMPLANT
GOWN STRL REUS W/ TWL LRG LVL3 (GOWN DISPOSABLE) ×2 IMPLANT
GOWN STRL REUS W/TWL LRG LVL3 (GOWN DISPOSABLE) ×6
LENS IOL DIOP 17.5 (Intraocular Lens) ×3 IMPLANT
LENS IOL TECNIS MONO 17.5 (Intraocular Lens) IMPLANT
MARKER SKIN DUAL TIP RULER LAB (MISCELLANEOUS) ×3 IMPLANT
PACK DR. KING ARMS (PACKS) ×3 IMPLANT
PACK EYE AFTER SURG (MISCELLANEOUS) ×3 IMPLANT
PACK OPTHALMIC (MISCELLANEOUS) ×3 IMPLANT
SYR 3ML LL SCALE MARK (SYRINGE) ×3 IMPLANT
SYR TB 1ML LUER SLIP (SYRINGE) ×3 IMPLANT
WATER STERILE IRR 250ML POUR (IV SOLUTION) ×3 IMPLANT
WIPE NON LINTING 3.25X3.25 (MISCELLANEOUS) ×3 IMPLANT

## 2020-04-15 NOTE — Anesthesia Postprocedure Evaluation (Signed)
Anesthesia Post Note  Patient: Anthony Olsen  Procedure(s) Performed: CATARACT EXTRACTION PHACO AND INTRAOCULAR LENS PLACEMENT (IOC) RIGHT (Right Eye)     Patient location during evaluation: PACU Anesthesia Type: MAC Level of consciousness: awake and alert Pain management: pain level controlled Vital Signs Assessment: post-procedure vital signs reviewed and stable Respiratory status: spontaneous breathing, nonlabored ventilation, respiratory function stable and patient connected to nasal cannula oxygen Cardiovascular status: stable and blood pressure returned to baseline Postop Assessment: no apparent nausea or vomiting Anesthetic complications: no   No complications documented.  Grayling Schranz A  Theadora Noyes

## 2020-04-15 NOTE — Op Note (Signed)
OPERATIVE NOTE  Anthony Olsen [id number] 04/15/2020   PREOPERATIVE DIAGNOSIS:  Nuclear sclerotic cataract right eye.  H25.11   POSTOPERATIVE DIAGNOSIS:    Nuclear sclerotic cataract right eye.     PROCEDURE:  Phacoemusification with posterior chamber intraocular lens placement of the right eye   LENS:   Implant Name Type Inv. Item Serial No. Manufacturer Lot No. LRB No. Used Action  LENS IOL DIOP 17.5 - [id number] Intraocular Lens LENS IOL DIOP 17.5 [id number] JOHNSON   Right 1 Implanted       Procedure(s) with comments: CATARACT EXTRACTION PHACO AND INTRAOCULAR LENS PLACEMENT (IOC) RIGHT (Right) - 0.86 0:17.4  DCB00 +17.5   ULTRASOUND TIME: 0 minutes 17 seconds.  CDE 0.86   SURGEON:  Willey Blade, MD, MPH  ANESTHESIOLOGIST: Anesthesiologist: Heniser, Burman Foster, MD CRNA: Jimmy Picket, CRNA; Maree Krabbe, CRNA   ANESTHESIA:  Topical with tetracaine drops augmented with 1% preservative-free intracameral lidocaine.  ESTIMATED BLOOD LOSS: less than 1 mL.   COMPLICATIONS:  None.   DESCRIPTION OF PROCEDURE:  The patient was identified in the holding room and transported to the operating room and placed in the supine position under the operating microscope.  The right eye was identified as the operative eye and it was prepped and draped in the usual sterile ophthalmic fashion.   A 1.0 millimeter clear-corneal paracentesis was made at the 10:30 position. 0.5 ml of preservative-free 1% lidocaine with epinephrine was injected into the anterior chamber.  The anterior chamber was filled with Healon 5 viscoelastic.  A 2.4 millimeter keratome was used to make a near-clear corneal incision at the 8:00 position.  A curvilinear capsulorrhexis was made with a cystotome and capsulorrhexis forceps.  Balanced salt solution was used to hydrodissect and hydrodelineate the nucleus.   Phacoemulsification was then used in stop and chop fashion to remove the lens nucleus and epinucleus.  The  remaining cortex was then removed using the irrigation and aspiration handpiece. Healon was then placed into the capsular bag to distend it for lens placement.  A lens was then injected into the capsular bag.  The remaining viscoelastic was aspirated.   Wounds were hydrated with balanced salt solution.  The anterior chamber was inflated to a physiologic pressure with balanced salt solution.   Intracameral vigamox 0.1 mL undiluted was injected into the eye and a drop placed onto the ocular surface.  No wound leaks were noted.  The patient was taken to the recovery room in stable condition without complications of anesthesia or surgery  Willey Blade 04/15/2020, 2:28 PM

## 2020-04-15 NOTE — H&P (Signed)
[facility]   Primary Care Physician:  Patient, No Pcp Per Ophthalmologist: Dr. Willey Blade  Pre-Procedure History & Physical: HPI:  Anthony Olsen is a 59 y.o. male here for cataract surgery.   Past Medical History:  Diagnosis Date  . Arthritis    hands  . COVID-19 10/2018  . Gout   . Hypertension     Past Surgical History:  Procedure Laterality Date  . CATARACT EXTRACTION W/PHACO Left 02/12/2020   Procedure: CATARACT EXTRACTION PHACO AND INTRAOCULAR LENS PLACEMENT (IOC) LEFT;  Surgeon: Nevada Crane, MD;  Location: [facility];  Service: Ophthalmology;  Laterality: Left;  4.63 0:30.5  . UPPER GI ENDOSCOPY      Prior to Admission medications   Medication Sig Start Date End Date Taking? Authorizing Provider  naproxen sodium (ALEVE) 220 MG tablet Take 220-440 mg by mouth 2 (two) times daily as needed (arthritis pain).    Yes [provider]    Allergies as of 02/22/2020 - Review Complete 02/12/2020  Allergen Reaction Noted  . Shellfish allergy  02/06/2020    History reviewed. No pertinent family history.  Social History   Socioeconomic History  . Marital status: Married    Spouse name: Not on file  . Number of children: Not on file  . Years of education: Not on file  . Highest education level: Not on file  Occupational History  . Not on file  Tobacco Use  . Smoking status: Former Games developer  . Smokeless tobacco: Never Used  Vaping Use  . Vaping Use: Never used  Substance and Sexual Activity  . Alcohol use: Yes    Alcohol/week: 14.0 standard drinks    Types: 14 Cans of beer per week  . Drug use: Not on file  . Sexual activity: Not on file  Other Topics Concern  . Not on file  Social History Narrative  . Not on file   Social Determinants of Health   Financial Resource Strain:   . Difficulty of Paying Living Expenses: Not on file  Food Insecurity:   . Worried About Programme researcher, broadcasting/film/video in the Last Year: Not on file  . Ran  Out of Food in the Last Year: Not on file  Transportation Needs:   . Lack of Transportation (Medical): Not on file  . Lack of Transportation (Non-Medical): Not on file  Physical Activity:   . Days of Exercise per Week: Not on file  . Minutes of Exercise per Session: Not on file  Stress:   . Feeling of Stress : Not on file  Social Connections:   . Frequency of Communication with Friends and Family: Not on file  . Frequency of Social Gatherings with Friends and Family: Not on file  . Attends Religious Services: Not on file  . Active Member of Clubs or Organizations: Not on file  . Attends Banker Meetings: Not on file  . Marital Status: Not on file  Intimate Partner Violence:   . Fear of Current or Ex-Partner: Not on file  . Emotionally Abused: Not on file  . Physically Abused: Not on file  . Sexually Abused: Not on file    Review of Systems: See HPI, otherwise negative ROS  Physical Exam: BP (!) 158/95   Pulse 68   Temp 98.1 F (36.7 C) (Temporal)   Ht 6\' 1"  (1.854 m)   Wt 115.7 kg   SpO2 98%   BMI 33.64 kg/m  General:   Alert,  pleasant  and cooperative in NAD Head:  Normocephalic and atraumatic.  Impression/Plan: DAREK EIFLER is here for cataract surgery.  Risks, benefits, limitations, and alternatives regarding cataract surgery have been reviewed with the patient.  Questions have been answered.  All parties agreeable.   Willey Blade, MD  04/15/2020, 1:57 PM

## 2020-04-15 NOTE — Anesthesia Procedure Notes (Signed)
Procedure Name: MAC Date/Time: 04/15/2020 2:04 PM Performed by: Cameron Ali, CRNA Pre-anesthesia Checklist: Patient identified, Emergency Drugs available, Suction available, Timeout performed and Patient being monitored Patient Re-evaluated:Patient Re-evaluated prior to induction Oxygen Delivery Method: Nasal cannula Placement Confirmation: positive ETCO2

## 2020-04-15 NOTE — Transfer of Care (Signed)
Immediate Anesthesia Transfer of Care Note  Patient: Anthony Olsen  Procedure(s) Performed: CATARACT EXTRACTION PHACO AND INTRAOCULAR LENS PLACEMENT (IOC) RIGHT (Right Eye)  Patient Location: PACU  Anesthesia Type: MAC  Level of Consciousness: awake, alert  and patient cooperative  Airway and Oxygen Therapy: Patient Spontanous Breathing and Patient connected to supplemental oxygen  Post-op Assessment: Post-op Vital signs reviewed, Patient's Cardiovascular Status Stable, Respiratory Function Stable, Patent Airway and No signs of Nausea or vomiting  Post-op Vital Signs: Reviewed and stable  Complications: No complications documented.

## 2020-04-16 ENCOUNTER — Encounter: Payer: Self-pay | Admitting: Ophthalmology

## 2022-08-13 ENCOUNTER — Other Ambulatory Visit: Payer: Self-pay | Admitting: Physician Assistant

## 2022-08-13 DIAGNOSIS — G8929 Other chronic pain: Secondary | ICD-10-CM

## 2022-08-13 DIAGNOSIS — S83229A Peripheral tear of medial meniscus, current injury, unspecified knee, initial encounter: Secondary | ICD-10-CM

## 2022-08-26 ENCOUNTER — Ambulatory Visit
Admission: RE | Admit: 2022-08-26 | Discharge: 2022-08-26 | Disposition: A | Discharge status: Home / Self Care | Payer: Managed Care, Other (non HMO) | Source: Ambulatory Visit | Attending: Physician Assistant | Admitting: Physician Assistant

## 2022-08-26 DIAGNOSIS — G8929 Other chronic pain: Secondary | ICD-10-CM

## 2022-09-24 DIAGNOSIS — I350 Nonrheumatic aortic (valve) stenosis: Secondary | ICD-10-CM

## 2022-09-24 HISTORY — DX: Nonrheumatic aortic (valve) stenosis: I35.0

## 2022-09-30 ENCOUNTER — Other Ambulatory Visit: Payer: Self-pay | Admitting: Internal Medicine

## 2022-09-30 DIAGNOSIS — R011 Cardiac murmur, unspecified: Secondary | ICD-10-CM

## 2022-10-21 ENCOUNTER — Other Ambulatory Visit: Payer: Managed Care, Other (non HMO)

## 2022-10-23 ENCOUNTER — Other Ambulatory Visit: Payer: Self-pay | Admitting: Surgery

## 2022-10-23 ENCOUNTER — Encounter
Admission: RE | Admit: 2022-10-23 | Discharge: 2022-10-23 | Disposition: A | Discharge status: Home / Self Care | Payer: Managed Care, Other (non HMO) | Source: Ambulatory Visit | Attending: Surgery

## 2022-10-23 VITALS — BP 176/81 | HR 55 | Resp 16 | Ht 73.0 in | Wt 240.5 lb

## 2022-10-23 DIAGNOSIS — I10 Essential (primary) hypertension: Secondary | ICD-10-CM | POA: Insufficient documentation

## 2022-10-23 DIAGNOSIS — Z01812 Encounter for preprocedural laboratory examination: Secondary | ICD-10-CM | POA: Diagnosis present

## 2022-10-23 HISTORY — DX: Unspecified right bundle-branch block: I45.10

## 2022-10-23 HISTORY — DX: Cardiac murmur, unspecified: R01.1

## 2022-10-23 HISTORY — DX: Nonrheumatic aortic (valve) stenosis: I35.0

## 2022-10-23 HISTORY — DX: Hyperlipidemia, unspecified: E78.5

## 2022-10-23 HISTORY — DX: Gastro-esophageal reflux disease without esophagitis: K21.9

## 2022-10-23 LAB — TYPE AND SCREEN
ABO/RH(D): A POS
Antibody Screen: NEGATIVE

## 2022-10-23 LAB — COMPREHENSIVE METABOLIC PANEL
ALT: 20 U/L (ref 0–44)
AST: 18 U/L (ref 15–41)
Albumin: 4.2 g/dL (ref 3.5–5.0)
Alkaline Phosphatase: 39 U/L (ref 38–126)
Anion gap: 6 (ref 5–15)
BUN: 13 mg/dL (ref 6–20)
CO2: 29 mmol/L (ref 22–32)
Calcium: 9.1 mg/dL (ref 8.9–10.3)
Chloride: 102 mmol/L (ref 98–111)
Creatinine, Ser: 0.76 mg/dL (ref 0.61–1.24)
GFR, Estimated: >60 mL/min (ref >60)
Glucose, Bld: 102 mg/dL — ABNORMAL HIGH (ref 70–99)
Potassium: 3.2 mmol/L — ABNORMAL LOW (ref 3.5–5.1)
Sodium: 137 mmol/L (ref 135–145)
Total Bilirubin: 1.3 mg/dL — ABNORMAL HIGH (ref 0.3–1.2)
Total Protein: 7.2 g/dL (ref 6.5–8.1)

## 2022-10-23 LAB — URINALYSIS, ROUTINE W REFLEX MICROSCOPIC
Bilirubin Urine: NEGATIVE
Glucose, UA: NEGATIVE mg/dL
Hgb urine dipstick: NEGATIVE
Ketones, ur: NEGATIVE mg/dL
Leukocytes,Ua: NEGATIVE
Nitrite: NEGATIVE
Protein, ur: NEGATIVE mg/dL
Specific Gravity, Urine: 1.015 (ref 1.005–1.030)
pH: 5 (ref 5.0–8.0)

## 2022-10-23 LAB — CBC
HCT: 39.9 % (ref 39.0–52.0)
Hemoglobin: 13.9 g/dL (ref 13.0–17.0)
MCH: 31 pg (ref 26.0–34.0)
MCHC: 34.8 g/dL (ref 30.0–36.0)
MCV: 88.9 fL (ref 80.0–100.0)
Platelets: 186 10*3/uL (ref 150–400)
RBC: 4.49 MIL/uL (ref 4.22–5.81)
RDW: 12.5 % (ref 11.5–15.5)
WBC: 4.7 10*3/uL (ref 4.0–10.5)
nRBC: 0 % (ref 0.0–0.2)

## 2022-10-23 LAB — SURGICAL PCR SCREEN
MRSA, PCR: NEGATIVE
Staphylococcus aureus: NEGATIVE

## 2022-10-23 NOTE — Patient Instructions (Addendum)
Your procedure is scheduled on:10-29-22 Thursday Report to the Registration Desk on the 1st floor of the [facility].Then proceed to the 2nd floor Surgery Desk To find out your arrival time, please call [phone] between 1PM - 3PM on:10-28-22 Wednesday If your arrival time is 6:00 am, do not arrive before that time as the Medical Mall entrance doors do not open until 6:00 am.  REMEMBER: Instructions that are not followed completely may result in serious medical risk, up to and including death; or upon the discretion of your surgeon and anesthesiologist your surgery may need to be rescheduled.  Do not eat food after midnight the night before surgery.  No gum chewing or hard candies.  You may however, drink CLEAR liquids up to 2 hours before you are scheduled to arrive for your surgery. Do not drink anything within 2 hours of your scheduled arrival time.  Clear liquids include: - water  - apple juice without pulp - gatorade (not RED colors) - black coffee or tea (Do NOT add milk or creamers to the coffee or tea) Do NOT drink anything that is not on this list.  In addition, your doctor has ordered for you to drink the provided:  Ensure Pre-Surgery Clear Carbohydrate Drink  Drinking this carbohydrate drink up to two hours before surgery helps to reduce insulin resistance and improve patient outcomes. Please complete drinking 2 hours before scheduled arrival time.  One week prior to surgery: Stop Anti-inflammatories (NSAIDS) such as Advil, Aleve, Ibuprofen, Motrin, Naproxen, Naprosyn and Aspirin based products such as Excedrin, Goody's Powder, BC Powder.You may however, take Tylenol if needed for pain up until the day of surgery. Stop ANY OVER THE COUNTER supplements/vitamins NOW (10-23-22) until after surgery.  TAKE ONLY THESE MEDICATIONS THE MORNING OF SURGERY WITH A SIP OF WATER: -metoprolol succinate (TOPROL-XL)   Continue your lisinopril (ZESTRIL) up until the day prior to surgery-Do  NOT take the morning of surgery  No Alcohol for 24 hours before or after surgery.  No Smoking including e-cigarettes for 24 hours before surgery.  No chewable tobacco products for at least 6 hours before surgery.  No nicotine patches on the day of surgery.  Do not use any "recreational" drugs for at least a week (preferably 2 weeks) before your surgery.  Please be advised that the combination of cocaine and anesthesia may have negative outcomes, up to and including death. If you test positive for cocaine, your surgery will be cancelled.  On the morning of surgery brush your teeth with toothpaste and water, you may rinse your mouth with mouthwash if you wish. Do not swallow any toothpaste or mouthwash.  Use CHG Soap as directed on instruction sheet.  Do not wear jewelry, make-up, hairpins, clips or nail polish.  Do not wear lotions, powders, or perfumes.   Do not shave body hair from the neck down 48 hours before surgery.  Contact lenses, hearing aids and dentures may not be worn into surgery.  Do not bring valuables to the hospital. [facility] is not responsible for any missing/lost belongings or valuables.    Notify your doctor if there is any change in your medical condition (cold, fever, infection).  Wear comfortable clothing (specific to your surgery type) to the hospital.  After surgery, you can help prevent lung complications by doing breathing exercises.  Take deep breaths and cough every 1-2 hours. Your doctor may order a device called an Incentive Spirometer to help you take deep breaths. When coughing or sneezing,  hold a pillow firmly against your incision with both hands. This is called "splinting." Doing this helps protect your incision. It also decreases belly discomfort.  If you are being admitted to the hospital overnight, leave your suitcase in the car. After surgery it may be brought to your room.  In case of increased patient census, it may be necessary for  you, the patient, to continue your postoperative care in the Same Day Surgery department.  If you are being discharged the day of surgery, you will not be allowed to drive home. You will need a responsible individual to drive you home and stay with you for 24 hours after surgery.   If you are taking public transportation, you will need to have a responsible individual with you.  Please call the Pre-admissions Testing Dept. at [phone] if you have any questions about these instructions.  Surgery Visitation Policy:  Patients having surgery or a procedure may have two visitors.  Children under the age of 89 must have an adult with them who is not the patient.  Inpatient Visitation:    Visiting hours are 7 a.m. to 8 p.m. Up to four visitors are allowed at one time in a patient room. The visitors may rotate out with other people during the day.  One visitor age 50 or older may stay with the patient overnight and must be in the room by 8 p.m.  How to Use an Incentive Spirometer An incentive spirometer is a tool that measures how well you are filling your lungs with each breath. Learning to take long, deep breaths using this tool can help you keep your lungs clear and active. This may help to reverse or lessen your chance of developing breathing (pulmonary) problems, especially infection. You may be asked to use a spirometer: After a surgery. If you have a lung problem or a history of smoking. After a long period of time when you have been unable to move or be active. If the spirometer includes an indicator to show the highest number that you have reached, your health care provider or respiratory therapist will help you set a goal. Keep a log of your progress as told by your health care provider. What are the risks? Breathing too quickly may cause dizziness or cause you to pass out. Take your time so you do not get dizzy or light-headed. If you are in pain, you may need to take pain  medicine before doing incentive spirometry. It is harder to take a deep breath if you are having pain. How to use your incentive spirometer  Sit up on the edge of your bed or on a chair. Hold the incentive spirometer so that it is in an upright position. Before you use the spirometer, breathe out normally. Place the mouthpiece in your mouth. Make sure your lips are closed tightly around it. Breathe in slowly and as deeply as you can through your mouth, causing the piston or the ball to rise toward the top of the chamber. Hold your breath for 3-5 seconds, or for as long as possible. If the spirometer includes a coach indicator, use this to guide you in breathing. Slow down your breathing if the indicator goes above the marked areas. Remove the mouthpiece from your mouth and breathe out normally. The piston or ball will return to the bottom of the chamber. Rest for a few seconds, then repeat the steps 10 or more times. Take your time and take a few normal breaths  between deep breaths so that you do not get dizzy or light-headed. Do this every 1-2 hours when you are awake. If the spirometer includes a goal marker to show the highest number you have reached (best effort), use this as a goal to work toward during each repetition. After each set of 10 deep breaths, cough a few times. This will help to make sure that your lungs are clear. If you have an incision on your chest or abdomen from surgery, place a pillow or a rolled-up towel firmly against the incision when you cough. This can help to reduce pain while taking deep breaths and coughing. General tips When you are able to get out of bed: Walk around often. Continue to take deep breaths and cough in order to clear your lungs. Keep using the incentive spirometer until your health care provider says it is okay to stop using it. If you have been in the hospital, you may be told to keep using the spirometer at home. Contact a health care provider  if: You are having difficulty using the spirometer. You have trouble using the spirometer as often as instructed. Your pain medicine is not giving enough relief for you to use the spirometer as told. You have a fever. Get help right away if: You develop shortness of breath. You develop a cough with bloody mucus from the lungs. You have fluid or blood coming from an incision site after you cough. Summary An incentive spirometer is a tool that can help you learn to take long, deep breaths to keep your lungs clear and active. You may be asked to use a spirometer after a surgery, if you have a lung problem or a history of smoking, or if you have been inactive for a long period of time. Use your incentive spirometer as instructed every 1-2 hours while you are awake. If you have an incision on your chest or abdomen, place a pillow or a rolled-up towel firmly against your incision when you cough. This will help to reduce pain. Get help right away if you have shortness of breath, you cough up bloody mucus, or blood comes from your incision when you cough. This information is not intended to replace advice given to you by your health care provider. Make sure you discuss any questions you have with your health care provider. Document Revised: 09/04/2019 Document Reviewed: 09/04/2019 Elsevier Patient Education  2023 [employer].

## 2022-10-26 ENCOUNTER — Encounter: Payer: Self-pay | Admitting: Surgery

## 2022-10-26 NOTE — Progress Notes (Signed)
Perioperative / Anesthesia Services  Pre-Admission Testing Clinical Review / Preoperative Anesthesia Consult  Date: 10/26/22  Patient Demographics:  Name: Anthony Olsen DOB:   21-Dec-1960 MRN:   [id number]  Planned Surgical Procedure(s):    Case: [id number] Date/Time: 10/29/22 0715   Procedure: TOTAL KNEE ARTHROPLASTY - RNFA (Left: Knee)   Anesthesia type: Choice   Pre-op diagnosis:      Primary osteoarthritis of left knee M17.12     Complex tear of medial meniscus of left knee as current injury, initial encounter S83.232A   Location: [facility] OR ROOM 03 / [facility]   Surgeons: Christena Flake, MD     NOTE: Available PAT nursing documentation and vital signs have been reviewed. Clinical nursing staff has updated patient's PMH/PSHx, current medication list, and drug allergies/intolerances to ensure comprehensive history available to assist in medical decision making as it pertains to the aforementioned surgical procedure and anticipated anesthetic course. Extensive review of available clinical information personally performed. [facility] and PSHx updated with any diagnoses/procedures that  may have been inadvertently omitted during his intake with the pre-admission testing department's nursing staff.  Clinical Discussion:  Anthony Olsen is a 62 y.o. male who is submitted for pre-surgical anesthesia review and clearance prior to him undergoing the above procedure. Patient has never been a smoker. Pertinent PMH includes: aortic stenosis, diastolic dysfunction, IRBBB, HTN, HLD, GERD (no daily Tx), OA, tear of LEFT medial meniscus.  Patient is followed by cardiology Juliann Pares, MD). He was last seen in the cardiology clinic on 09/30/2022; notes reviewed. At the time of his clinic visit, patient doing well overall from a cardiovascular perspective. Patient denied any chest pain, shortness of breath, PND, orthopnea, palpitations, significant peripheral edema, weakness,  fatigue, vertiginous symptoms, or presyncope/syncope. Patient with a past medical history significant for cardiovascular diagnoses. Documented physical exam was grossly benign, providing no evidence of acute exacerbation and/or decompensation of the patient's known cardiovascular conditions.  Most recent TTE was performed on 09/16/2022 revealing a normal left ventricular systolic function with an EF of >55%.  There were no regional wall motion abnormalities. Left ventricular diastolic Doppler parameters consistent with abnormal relaxation (G1DD).  Right ventricular size and function normal.  There was trivial PR, in addition to mild aortic, mitral, and tricuspid valve regurgitation.  Mild aortic valve stenosis noted; mean transaortic valve gradient 17.9 mmHg with a AVA (VTI) = 1.1 cm.  Blood pressure reasonably controlled at 130/70 mmHg on currently prescribed ACEi (lisinopril) and beta-blocker (metoprolol succinate) therapies.  Patient is not on any type of lipid-lowering therapies for his HLD diagnosis and ASCVD prevention.  He is not diabetic.  Patient does not have an OSAH diagnosis.  Functional capacity limited by arthritides and acute medial meniscus tear in his LEFT knee.  With that being said, patient still felt to be able to achieve at least 4 METS of physical activity without experiencing any degree of significant angina/anginal equivalent symptoms.  Patient has a family history of heart disease.  Given history, cardiac CTA was ordered.  No changes were made to his medication regimen.  Patient to follow-up with outpatient cardiology in 1 year or sooner if needed.  Anthony Olsen is scheduled for an elective TOTAL LEFT KNEE ARTHROPLASTY on 10/29/2022 with Dr. Leron Croak, MD.  Given patient's past medical history significant for cardiovascular diagnoses, presurgical cardiac clearance was sought by the PAT team. Per cardiology, "this patient is optimized for surgery and may proceed with the planned  procedural course with a ACCEPTABLE risk of significant perioperative cardiovascular complications".  In review of his medication reconciliation, the patient is not noted to be taking any type of anticoagulation or antiplatelet therapies that would need to be held during his perioperative course.  Patient denies previous perioperative complications with anesthesia in the past. In review of the available records, it is noted that patient underwent a MAC anesthetic course at [facility] (ASA II) in 03/2020 without documented complications.      10/23/2022    9:22 AM 10/23/2022    8:00 AM 04/15/2020    2:33 PM  Vitals with BMI  Height  6\' 1"    Weight  240 lbs 8 oz   BMI  31.74   Systolic 176  156  Diastolic 81  95  Pulse 55  74    Providers/Specialists:   NOTE: Primary physician provider listed below. Patient may have been seen by APP or partner within same practice.   PROVIDER ROLE / SPECIALTY LAST OV  Poggi, Excell Seltzer, MD Orthopedics (Surgeon) 10/22/2022  Patient, No Pcp Per Primary Care Provider N/A  Rudean Hitt, MD Cardiology 09/30/2022   Allergies:  Shellfish allergy  Current Home Medications:   No current facility-administered medications for this encounter.    acetaminophen (TYLENOL) 650 MG CR tablet   lisinopril (ZESTRIL) 40 MG tablet   metoprolol succinate (TOPROL-XL) 50 MG 24 hr tablet   History:   Past Medical History:  Diagnosis Date   Aortic stenosis 09/24/2022   a.) TTE 09/24/2022: mild AS with MPD 17.9; AVA (VTI) = 1.2 cm^2   Arthritis of both hands    Diastolic dysfunction    a.) TTE 09/24/2022: EF >55%, mild BAE, mild RVE. triv PR, mild AR/MR/TR, G1DD   GERD (gastroesophageal reflux disease)    Gout    Heart murmur    History of 2019 novel coronavirus disease (COVID-19) 11/02/2018   Hyperlipidemia    Hypertension    Incomplete right bundle branch block (RBBB)    Past Surgical History:  Procedure Laterality Date   BRONCHOSCOPIC REMOVAL  OF FOREIGN BODY  2020   CATARACT EXTRACTION W/PHACO Left 02/12/2020   Procedure: CATARACT EXTRACTION PHACO AND INTRAOCULAR LENS PLACEMENT (IOC) LEFT;  Surgeon: Nevada Crane, MD;  Location: [facility];  Service: Ophthalmology;  Laterality: Left;  4.63 0:30.5   CATARACT EXTRACTION W/PHACO Right 04/15/2020   Procedure: CATARACT EXTRACTION PHACO AND INTRAOCULAR LENS PLACEMENT (IOC) RIGHT;  Surgeon: Nevada Crane, MD;  Location: [facility];  Service: Ophthalmology;  Laterality: Right;  0.86 0:17.4   COLONOSCOPY     UPPER GI ENDOSCOPY     No family history on file. Social History   Tobacco Use   Smoking status: Never   Smokeless tobacco: Never  Vaping Use   Vaping Use: Never used  Substance Use Topics   Alcohol use: Yes    Alcohol/week: 14.0 standard drinks of alcohol    Types: 14 Cans of beer per week    Comment: occ    Pertinent Clinical Results:  LABS:   No visits with results within 3 Day(s) from this visit.  Latest known visit with results is:  Hospital Outpatient Visit on 10/23/2022  Component Date Value Ref Range Status   WBC 10/23/2022 4.7  4.0 - 10.5 K/uL Final   RBC 10/23/2022 4.49  4.22 - 5.81 MIL/uL Final   Hemoglobin 10/23/2022 13.9  13.0 - 17.0 g/dL Final   HCT 16/03/9603 39.9  39.0 - 52.0 %  Final   MCV 10/23/2022 88.9  80.0 - 100.0 fL Final   MCH 10/23/2022 31.0  26.0 - 34.0 pg Final   MCHC 10/23/2022 34.8  30.0 - 36.0 g/dL Final   RDW 16/03/9603 12.5  11.5 - 15.5 % Final   Platelets 10/23/2022 186  150 - 400 K/uL Final   nRBC 10/23/2022 0.0  0.0 - 0.2 % Final   Performed at [facility], [address] [place], [place] [address]   Sodium 10/23/2022 137  135 - 145 mmol/L Final   Potassium 10/23/2022 3.2 (L)  3.5 - 5.1 mmol/L Final   Chloride 10/23/2022 102  98 - 111 mmol/L Final   CO2 10/23/2022 29  22 - 32 mmol/L Final   Glucose, Bld 10/23/2022 102 (H)  70 - 99 mg/dL Final   Glucose reference range applies only to  samples taken after fasting for at least 8 hours.   BUN 10/23/2022 13  6 - 20 mg/dL Final   Creatinine, Ser 10/23/2022 0.76  0.61 - 1.24 mg/dL Final   Calcium 11/91/4782 9.1  8.9 - 10.3 mg/dL Final   Total Protein 95/62/1308 7.2  6.5 - 8.1 g/dL Final   Albumin 65/78/4696 4.2  3.5 - 5.0 g/dL Final   AST 29/52/8413 18  15 - 41 U/L Final   ALT 10/23/2022 20  0 - 44 U/L Final   Alkaline Phosphatase 10/23/2022 39  38 - 126 U/L Final   Total Bilirubin 10/23/2022 1.3 (H)  0.3 - 1.2 mg/dL Final   GFR, Estimated 10/23/2022 >60  >60 mL/min Final   Comment: (NOTE) Calculated using the CKD-EPI Creatinine Equation (2021)    Anion gap 10/23/2022 6  5 - 15 Final   Performed at [facility], [address] [place], [place] [address]   ABO/RH(D) 10/23/2022 A POS   Final   Antibody Screen 10/23/2022 NEG   Final   Sample Expiration 10/23/2022 11/06/2022,2359   Final   Extend sample reason 10/23/2022    Final                   Value:NO TRANSFUSIONS OR PREGNANCY IN THE PAST 3 MONTHS Performed at [facility], [address] [place], [place] [address]    MRSA, PCR 10/23/2022 NEGATIVE  NEGATIVE Final   Staphylococcus aureus 10/23/2022 NEGATIVE  NEGATIVE Final   Comment: (NOTE) The Xpert SA Assay (FDA approved for NASAL specimens in patients 63 years of age and older), is one component of a comprehensive surveillance program. It is not intended to diagnose infection nor to guide or monitor treatment. Performed at [facility], [address] [place], [place] [address]    Color, Urine 10/23/2022 YELLOW (A)  YELLOW Final   APPearance 10/23/2022 CLEAR (A)  CLEAR Final   Specific Gravity, Urine 10/23/2022 1.015  1.005 - 1.030 Final   pH 10/23/2022 5.0  5.0 - 8.0 Final   Glucose, UA 10/23/2022 NEGATIVE  NEGATIVE mg/dL Final   Hgb urine dipstick 10/23/2022 NEGATIVE  NEGATIVE Final   Bilirubin Urine 10/23/2022 NEGATIVE  NEGATIVE Final   Ketones, ur 10/23/2022 NEGATIVE   NEGATIVE mg/dL Final   Protein, ur 03/47/4259 NEGATIVE  NEGATIVE mg/dL Final   Nitrite 56/38/7564 NEGATIVE  NEGATIVE Final   Leukocytes,Ua 10/23/2022 NEGATIVE  NEGATIVE Final   Performed at [facility], [address] [place], [place] [address]    ECG: Date: 09/17/2022 Time ECG obtained: 0859 AM Rate: 95 bpm Rhythm: normal sinus; IRBBB Axis (leads I  and aVF): Left axis deviation Intervals: PR 158 ms. QRS 100 ms. QTc 354 ms. ST segment and T wave changes: No evidence of acute ST segment elevation or depression Comparison: Similar to previous tracing obtained on 07/10/2013 NOTE: Tracing obtained at [facility]; unable for review. Above based on cardiologist's interpretation.     IMAGING / PROCEDURES: TRANSTHORACIC ECHOCARDIOGRAM performed on 09/24/2022 Normal left ventricular systolic function with an EF of >55% No regional wall motion abnormalities Left ventricular diastolic Doppler parameters consistent with abnormal relaxation (G1DD). GLS -17% Mild right ventricular enlargement Mild biatrial enlargement Trivial PR Mild AR, MR, TR Mild aortic stenosis with a mean pressure gradient of 17.9 mmHg; AVA (VTI) = 1.1 cm  MR KNEE LEFT WO CONTRAST performed on 08/26/2022 Tricompartmental osteoarthritis; severe in the medial compartment Complex degenerative tearing of the medial meniscus Advanced mucoid degeneration of the ACL without evidence of tear Small moderate-sized joint effusion  MYOCARDIAL PERFUSION IMAGING STUDY (LEXISCAN) performed on 07/26/2013 Normal left ventricular systolic function with an EF of 56% Normal myocardial thickening and wall motion No artifact Left ventricular cavity size normal No evidence of stress-induced myocardial ischemia or arrhythmia; no scintigraphic evidence of scar Normal low risk study  Impression and Plan:  ABDIAZIZ KLAHN has been referred for pre-anesthesia review and clearance prior to him undergoing the planned  anesthetic and procedural courses. Available labs, pertinent testing, and imaging results were personally reviewed by me in preparation for upcoming operative/procedural course. [facility] medical record has been updated following extensive record review and patient interview with PAT staff.   This patient has been appropriately cleared by cardiology with an overall ACCEPTABLE risk of significant perioperative cardiovascular complications. Based on clinical review performed today (10/26/22), barring any significant acute changes in the patient's overall condition, it is anticipated that he will be able to proceed with the planned surgical intervention. Any acute changes in clinical condition may necessitate his procedure being postponed and/or cancelled. Patient will meet with anesthesia team (MD and/or CRNA) on the day of his procedure for preoperative evaluation/assessment. Questions regarding anesthetic course will be fielded at that time.   Pre-surgical instructions were reviewed with the patient during his PAT appointment, and questions were fielded to satisfaction by PAT clinical staff. He has been instructed on which medications that he will need to hold prior to surgery, as well as the ones that have been deemed safe/appropriate to take of the day of his procedure. As part of the general education provided by PAT, patient made aware both verbally and in writing, that he would need to abstain from the use of any illegal substances during his perioperative course.  He was advised that failure to follow the provided instructions could necessitate case cancellation or result serious perioperative complications up to and including death. Patient encouraged to contact PAT and/or his surgeon's office to discuss any questions or concerns that may arise prior to surgery; verbalized understanding.   Quentin Mulling, MSN, APRN, FNP-C, CEN [facility]  Peri-operative Services Nurse  Practitioner Phone: [phone] Fax: [phone] 10/26/22 3:33 PM  NOTE: This note has been prepared using Dragon dictation software. Despite my best ability to proofread, there is always the potential that unintentional transcriptional errors may still occur from this process.

## 2022-10-29 ENCOUNTER — Ambulatory Visit: Payer: Managed Care, Other (non HMO)

## 2022-10-29 ENCOUNTER — Other Ambulatory Visit: Payer: Self-pay

## 2022-10-29 ENCOUNTER — Encounter: Payer: Self-pay | Admitting: Surgery

## 2022-10-29 ENCOUNTER — Ambulatory Visit
Admission: RE | Admit: 2022-10-29 | Discharge: 2022-10-29 | Disposition: A | Discharge status: Home / Self Care | Payer: Managed Care, Other (non HMO) | Attending: Surgery | Admitting: Surgery

## 2022-10-29 ENCOUNTER — Ambulatory Visit: Payer: Managed Care, Other (non HMO) | Admitting: Urgent Care

## 2022-10-29 ENCOUNTER — Encounter
Admission: RE | Disposition: A | Discharge status: Home / Self Care | Payer: Self-pay | Source: Home / Self Care | Attending: Surgery

## 2022-10-29 DIAGNOSIS — I5032 Chronic diastolic (congestive) heart failure: Secondary | ICD-10-CM | POA: Diagnosis not present

## 2022-10-29 DIAGNOSIS — I110 Hypertensive heart disease with heart failure: Secondary | ICD-10-CM | POA: Diagnosis not present

## 2022-10-29 DIAGNOSIS — Z79899 Other long term (current) drug therapy: Secondary | ICD-10-CM | POA: Diagnosis not present

## 2022-10-29 DIAGNOSIS — M1712 Unilateral primary osteoarthritis, left knee: Secondary | ICD-10-CM | POA: Insufficient documentation

## 2022-10-29 DIAGNOSIS — S83232A Complex tear of medial meniscus, current injury, left knee, initial encounter: Secondary | ICD-10-CM | POA: Insufficient documentation

## 2022-10-29 DIAGNOSIS — I4510 Unspecified right bundle-branch block: Secondary | ICD-10-CM | POA: Insufficient documentation

## 2022-10-29 DIAGNOSIS — I350 Nonrheumatic aortic (valve) stenosis: Secondary | ICD-10-CM | POA: Diagnosis not present

## 2022-10-29 DIAGNOSIS — X58XXXA Exposure to other specified factors, initial encounter: Secondary | ICD-10-CM | POA: Diagnosis not present

## 2022-10-29 HISTORY — DX: Unspecified osteoarthritis, unspecified site: M19.90

## 2022-10-29 HISTORY — PX: TOTAL KNEE ARTHROPLASTY: SHX125

## 2022-10-29 HISTORY — DX: Cataract extraction status, right eye: Z98.41

## 2022-10-29 HISTORY — DX: Other ill-defined heart diseases: I51.89

## 2022-10-29 HISTORY — DX: Complex tear of medial meniscus, current injury, left knee, initial encounter: S83.232A

## 2022-10-29 HISTORY — DX: Cataract extraction status, right eye: Z98.42

## 2022-10-29 LAB — ABO/RH: ABO/RH(D): A POS

## 2022-10-29 SURGERY — ARTHROPLASTY, KNEE, TOTAL
Anesthesia: Spinal | Site: Knee | Laterality: Left

## 2022-10-29 MED ORDER — PROPOFOL 10 MG/ML IV BOLUS
INTRAVENOUS | Status: DC | PRN
  Administered 2022-10-29: 30 mg via INTRAVENOUS
  Administered 2022-10-29: 20 mg via INTRAVENOUS

## 2022-10-29 MED ORDER — LACTATED RINGERS IV SOLN: INTRAVENOUS | Status: DC

## 2022-10-29 MED ORDER — LIDOCAINE HCL (PF) 2 % IJ SOLN
INTRAMUSCULAR | Status: DC | PRN
  Administered 2022-10-29: 3 mL

## 2022-10-29 MED ORDER — BUPIVACAINE HCL (PF) 0.5 % IJ SOLN
INTRAMUSCULAR | Status: DC | PRN
  Administered 2022-10-29: 3 mL via INTRATHECAL

## 2022-10-29 MED ORDER — OXYCODONE HCL 5 MG PO TABS: 5.0000 mg | ORAL_TABLET | ORAL | Status: DC | PRN

## 2022-10-29 MED ORDER — APIXABAN 2.5 MG PO TABS: 2.5000 mg | ORAL_TABLET | Freq: Two times a day (BID) | ORAL | 0 refills | Status: DC

## 2022-10-29 MED ORDER — CEFAZOLIN SODIUM-DEXTROSE 2-4 GM/100ML-% IV SOLN
INTRAVENOUS | Status: AC
  Filled 2022-10-29: qty 100

## 2022-10-29 MED ORDER — SODIUM CHLORIDE 0.9 % IR SOLN
Status: DC | PRN
  Administered 2022-10-29: 3000 mL

## 2022-10-29 MED ORDER — ONDANSETRON HCL 4 MG/2ML IJ SOLN
INTRAMUSCULAR | Status: AC
  Filled 2022-10-29: qty 2

## 2022-10-29 MED ORDER — MIDAZOLAM HCL 5 MG/5ML IJ SOLN
INTRAMUSCULAR | Status: DC | PRN
  Administered 2022-10-29: 2 mg via INTRAVENOUS

## 2022-10-29 MED ORDER — BUPIVACAINE HCL (PF) 0.5 % IJ SOLN
INTRAMUSCULAR | Status: AC
  Filled 2022-10-29: qty 10

## 2022-10-29 MED ORDER — CEFAZOLIN SODIUM-DEXTROSE 2-4 GM/100ML-% IV SOLN
2.0000 g | Freq: Four times a day (QID) | INTRAVENOUS | Status: DC
  Administered 2022-10-29: 2 g via INTRAVENOUS

## 2022-10-29 MED ORDER — PROPOFOL 10 MG/ML IV BOLUS
INTRAVENOUS | Status: AC
  Filled 2022-10-29: qty 20

## 2022-10-29 MED ORDER — PROPOFOL 500 MG/50ML IV EMUL
INTRAVENOUS | Status: DC | PRN
  Administered 2022-10-29: 125 ug/kg/min via INTRAVENOUS

## 2022-10-29 MED ORDER — BUPIVACAINE HCL (PF) 0.5 % IJ SOLN
INTRAMUSCULAR | Status: AC
  Filled 2022-10-29: qty 30

## 2022-10-29 MED ORDER — ONDANSETRON HCL 4 MG/2ML IJ SOLN: 4.0000 mg | Freq: Once | INTRAMUSCULAR | Status: DC | PRN

## 2022-10-29 MED ORDER — KETOROLAC TROMETHAMINE 30 MG/ML IJ SOLN
INTRAMUSCULAR | Status: AC
  Filled 2022-10-29: qty 1

## 2022-10-29 MED ORDER — GLYCOPYRROLATE 0.2 MG/ML IJ SOLN
INTRAMUSCULAR | Status: AC
  Filled 2022-10-29: qty 1

## 2022-10-29 MED ORDER — METOCLOPRAMIDE HCL 5 MG/ML IJ SOLN: 5.0000 mg | Freq: Three times a day (TID) | INTRAMUSCULAR | Status: DC | PRN

## 2022-10-29 MED ORDER — EPINEPHRINE PF 1 MG/ML IJ SOLN
INTRAMUSCULAR | Status: AC
  Filled 2022-10-29: qty 1

## 2022-10-29 MED ORDER — BUPIVACAINE HCL (PF) 0.25 % IJ SOLN
INTRAMUSCULAR | Status: AC
  Filled 2022-10-29: qty 30

## 2022-10-29 MED ORDER — TRANEXAMIC ACID 1000 MG/10ML IV SOLN
INTRAVENOUS | Status: AC
  Filled 2022-10-29: qty 10

## 2022-10-29 MED ORDER — METOCLOPRAMIDE HCL 10 MG PO TABS: 5.0000 mg | ORAL_TABLET | Freq: Three times a day (TID) | ORAL | Status: DC | PRN

## 2022-10-29 MED ORDER — ONDANSETRON HCL 4 MG/2ML IJ SOLN
INTRAMUSCULAR | Status: DC | PRN
  Administered 2022-10-29: 4 mg via INTRAVENOUS

## 2022-10-29 MED ORDER — ACETAMINOPHEN 325 MG PO TABS: 325.0000 mg | ORAL_TABLET | Freq: Four times a day (QID) | ORAL | Status: DC | PRN

## 2022-10-29 MED ORDER — CEFAZOLIN SODIUM-DEXTROSE 2-4 GM/100ML-% IV SOLN
2.0000 g | INTRAVENOUS | Status: AC
  Administered 2022-10-29: 2 g via INTRAVENOUS

## 2022-10-29 MED ORDER — FAMOTIDINE 20 MG PO TABS
ORAL_TABLET | ORAL | Status: AC
  Filled 2022-10-29: qty 1

## 2022-10-29 MED ORDER — ONDANSETRON HCL 4 MG/2ML IJ SOLN: 4.0000 mg | Freq: Four times a day (QID) | INTRAMUSCULAR | Status: DC | PRN

## 2022-10-29 MED ORDER — TRIAMCINOLONE ACETONIDE 40 MG/ML IJ SUSP
INTRAMUSCULAR | Status: DC | PRN
  Administered 2022-10-29: 93 mL via PERIARTICULAR

## 2022-10-29 MED ORDER — TRIAMCINOLONE ACETONIDE 40 MG/ML IJ SUSP
INTRAMUSCULAR | Status: AC
  Filled 2022-10-29: qty 2

## 2022-10-29 MED ORDER — SODIUM CHLORIDE 0.9 % BOLUS PEDS
250.0000 mL | Freq: Once | INTRAVENOUS | Status: AC
  Administered 2022-10-29: 250 mL via INTRAVENOUS

## 2022-10-29 MED ORDER — MIDAZOLAM HCL 2 MG/2ML IJ SOLN
INTRAMUSCULAR | Status: AC
  Filled 2022-10-29: qty 2

## 2022-10-29 MED ORDER — ORAL CARE MOUTH RINSE: 15.0000 mL | Freq: Once | OROMUCOSAL | Status: AC

## 2022-10-29 MED ORDER — 0.9 % SODIUM CHLORIDE (POUR BTL) OPTIME
TOPICAL | Status: DC | PRN
  Administered 2022-10-29: 500 mL

## 2022-10-29 MED ORDER — ONDANSETRON HCL 4 MG PO TABS: 4.0000 mg | ORAL_TABLET | Freq: Four times a day (QID) | ORAL | Status: DC | PRN

## 2022-10-29 MED ORDER — LIDOCAINE HCL (PF) 2 % IJ SOLN
INTRAMUSCULAR | Status: AC
  Filled 2022-10-29: qty 5

## 2022-10-29 MED ORDER — FAMOTIDINE 20 MG PO TABS
20.0000 mg | ORAL_TABLET | Freq: Once | ORAL | Status: AC
  Administered 2022-10-29: 20 mg via ORAL

## 2022-10-29 MED ORDER — OXYCODONE HCL 5 MG PO TABS: 5.0000 mg | ORAL_TABLET | ORAL | 0 refills | Status: DC | PRN

## 2022-10-29 MED ORDER — TRANEXAMIC ACID 1000 MG/10ML IV SOLN
INTRAVENOUS | Status: DC | PRN
  Administered 2022-10-29: 1000 mg via TOPICAL

## 2022-10-29 MED ORDER — BUPIVACAINE LIPOSOME 1.3 % IJ SUSP
INTRAMUSCULAR | Status: AC
  Filled 2022-10-29: qty 20

## 2022-10-29 MED ORDER — PROPOFOL 1000 MG/100ML IV EMUL
INTRAVENOUS | Status: AC
  Filled 2022-10-29: qty 100

## 2022-10-29 MED ORDER — CHLORHEXIDINE GLUCONATE 0.12 % MT SOLN
OROMUCOSAL | Status: AC
  Filled 2022-10-29: qty 15

## 2022-10-29 MED ORDER — SODIUM CHLORIDE 0.9 % IV SOLN: INTRAVENOUS | Status: DC

## 2022-10-29 MED ORDER — LIDOCAINE HCL (CARDIAC) PF 100 MG/5ML IV SOSY
PREFILLED_SYRINGE | INTRAVENOUS | Status: DC | PRN
  Administered 2022-10-29: 60 mg via INTRAVENOUS

## 2022-10-29 MED ORDER — FENTANYL CITRATE (PF) 100 MCG/2ML IJ SOLN: 25.0000 ug | INTRAMUSCULAR | Status: DC | PRN

## 2022-10-29 MED ORDER — CHLORHEXIDINE GLUCONATE 0.12 % MT SOLN
15.0000 mL | Freq: Once | OROMUCOSAL | Status: AC
  Administered 2022-10-29: 15 mL via OROMUCOSAL

## 2022-10-29 MED ORDER — KETOROLAC TROMETHAMINE 30 MG/ML IJ SOLN
30.0000 mg | Freq: Once | INTRAMUSCULAR | Status: AC
  Administered 2022-10-29: 30 mg via INTRAVENOUS

## 2022-10-29 MED ORDER — SODIUM CHLORIDE FLUSH 0.9 % IV SOLN
INTRAVENOUS | Status: AC
  Filled 2022-10-29: qty 40

## 2022-10-29 SURGICAL SUPPLY — 65 items
APL PRP STRL LF DISP 70% ISPRP (MISCELLANEOUS) ×1
BIT DRILL QUICK REL 1/8 2PK SL (DRILL) IMPLANT
BLADE SAW SAG 25X90X1.19 (BLADE) ×1 IMPLANT
BLADE SURG SZ20 CARB STEEL (BLADE) ×1 IMPLANT
BNDG CMPR STD VLCR NS LF 5.8X6 (GAUZE/BANDAGES/DRESSINGS) ×1
BNDG ELASTIC 6X5.8 VLCR NS LF (GAUZE/BANDAGES/DRESSINGS) ×1 IMPLANT
BRNG TIB 0D 75X10 ANT STAB (Insert) ×1 IMPLANT
CEMENT BONE R 1X40 (Cement) ×2 IMPLANT
CEMENT VACUUM MIXING SYSTEM (MISCELLANEOUS) ×1 IMPLANT
CHLORAPREP W/TINT 26 (MISCELLANEOUS) ×1 IMPLANT
COOLER POLAR GLACIER W/PUMP (MISCELLANEOUS) ×1 IMPLANT
COVER MAYO STAND REUSABLE (DRAPES) ×1 IMPLANT
CUFF TOURN SGL QUICK 24 (TOURNIQUET CUFF)
CUFF TOURN SGL QUICK 34 (TOURNIQUET CUFF)
CUFF TRNQT CYL 24X4X16.5-23 (TOURNIQUET CUFF) IMPLANT
CUFF TRNQT CYL 34X4.125X (TOURNIQUET CUFF) IMPLANT
DRAPE 3/4 80X56 (DRAPES) ×1 IMPLANT
DRAPE IMP U-DRAPE 54X76 (DRAPES) ×1 IMPLANT
DRAPE U-SHAPE 47X51 STRL (DRAPES) ×1 IMPLANT
DRSG MEPILEX SACRM 8.7X9.8 (GAUZE/BANDAGES/DRESSINGS) IMPLANT
DRSG OPSITE POSTOP 4X10 (GAUZE/BANDAGES/DRESSINGS) ×1 IMPLANT
DRSG OPSITE POSTOP 4X8 (GAUZE/BANDAGES/DRESSINGS) ×1 IMPLANT
ELECT CAUTERY BLADE 6.4 (BLADE) ×1 IMPLANT
ELECT REM PT RETURN 9FT ADLT (ELECTROSURGICAL) ×1
ELECTRODE REM PT RTRN 9FT ADLT (ELECTROSURGICAL) ×1 IMPLANT
FEMORAL CR LEFT  70MM (Joint) ×1 IMPLANT
FEMORAL CR LEFT 70MM (Joint) IMPLANT
GAUZE XEROFORM 1X8 LF (GAUZE/BANDAGES/DRESSINGS) ×1 IMPLANT
GLOVE BIO SURGEON STRL SZ7.5 (GLOVE) ×4 IMPLANT
GLOVE BIO SURGEON STRL SZ8 (GLOVE) ×4 IMPLANT
GLOVE BIOGEL PI IND STRL 8 (GLOVE) ×1 IMPLANT
GLOVE INDICATOR 8.0 STRL GRN (GLOVE) ×1 IMPLANT
GOWN STRL REUS W/ TWL LRG LVL3 (GOWN DISPOSABLE) ×1 IMPLANT
GOWN STRL REUS W/ TWL XL LVL3 (GOWN DISPOSABLE) ×1 IMPLANT
GOWN STRL REUS W/TWL LRG LVL3 (GOWN DISPOSABLE) ×1
GOWN STRL REUS W/TWL XL LVL3 (GOWN DISPOSABLE) ×1
HANDLE YANKAUER SUCT OPEN TIP (MISCELLANEOUS) ×1 IMPLANT
HOOD PEEL AWAY T7 (MISCELLANEOUS) ×3 IMPLANT
IMPL PATELLA POST 37X8.5 (Miscellaneous) IMPLANT
IMPLANT PATELLA POST 37X8.5 (Miscellaneous) ×1 IMPLANT
INSERT TIB BEARING 75 10 (Insert) IMPLANT
IV NS IRRIG 3000ML ARTHROMATIC (IV SOLUTION) ×1 IMPLANT
KIT TURNOVER KIT A (KITS) ×1 IMPLANT
MANIFOLD NEPTUNE II (INSTRUMENTS) ×1 IMPLANT
NDL SPNL 20GX3.5 QUINCKE YW (NEEDLE) ×1 IMPLANT
NEEDLE SPNL 20GX3.5 QUINCKE YW (NEEDLE) ×1 IMPLANT
NS IRRIG 1000ML POUR BTL (IV SOLUTION) ×1 IMPLANT
PACK TOTAL KNEE (MISCELLANEOUS) ×1 IMPLANT
PAD WRAPON POLAR KNEE (MISCELLANEOUS) ×1 IMPLANT
PENCIL SMOKE EVACUATOR (MISCELLANEOUS) ×1 IMPLANT
PLATE KNEE TIBIAL 75MM FIXED (Plate) IMPLANT
PULSAVAC PLUS IRRIG FAN TIP (DISPOSABLE) ×1
STAPLER SKIN PROX 35W (STAPLE) ×1 IMPLANT
SUCTION FRAZIER HANDLE 10FR (MISCELLANEOUS) ×1
SUCTION TUBE FRAZIER 10FR DISP (MISCELLANEOUS) ×1 IMPLANT
SUT VIC AB 0 CT1 36 (SUTURE) ×3 IMPLANT
SUT VIC AB 2-0 CT1 27 (SUTURE) ×3
SUT VIC AB 2-0 CT1 TAPERPNT 27 (SUTURE) ×3 IMPLANT
SYR 10ML LL (SYRINGE) ×1 IMPLANT
SYR 20ML LL LF (SYRINGE) ×1 IMPLANT
SYR 30ML LL (SYRINGE) IMPLANT
TIP FAN IRRIG PULSAVAC PLUS (DISPOSABLE) ×1 IMPLANT
TRAP FLUID SMOKE EVACUATOR (MISCELLANEOUS) ×2 IMPLANT
WATER STERILE IRR 500ML POUR (IV SOLUTION) ×1 IMPLANT
WRAPON POLAR PAD KNEE (MISCELLANEOUS) ×1

## 2022-10-29 NOTE — H&P (Signed)
History of Present Illness: Anthony Olsen is a 62 y.o.male who presented for history and physical for an upcoming left total knee arthroplasty to be done by Dr. Joice Lofts on Oct 29, 2022. The patient has seen Dr. Joice Lofts in the past and had an evaluation last month. The symptoms began several years ago and developed without any specific cause or injury . Initially, he saw Dr. Landry Mellow who initiated nonsurgical treatments including medications (including meloxicam, Tylenol, and diclofenac ointment), activity modification, and ice or ice alternating with heat. The patient apparently elected to pursue further care at [facility] and underwent a steroid injection which provided approximately 3 months worth of relief of his symptoms. Subsequently he underwent a series of viscosupplementation injections which also provided limited partial relief of his symptoms. Therefore, he elected to return to [facility] where he saw Van Clines, PA-C, last month. He ordered an MRI scan and referred him to me for further evaluation and treatment. He reports 2/10 pain in the knee on today's visit, but notes that the pain gets much worse with any prolonged standing or ambulation. The pain is located along the medial aspect of the knee. The pain is described as aching, dull, stabbing, and throbbing. The symptoms are aggravated with normal daily activities, using stairs, at higher levels of activity, walking, standing, and standing pivot. He also describes no mechanical symptoms. He has associated swelling and no deformity. He has tried acetaminophen, over-the-counter medications, steroid injections, viscosupplementation injections, and a home exercise program with temporary partial relief of his symptoms. 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 The patient is not a diabetic.  Current Outpatient Medications  Medication Sig Dispense Refill  lisinopriL (ZESTRIL) 40 MG tablet Take 1 tablet (40 mg total) by mouth once  daily 90 tablet 3  metoprolol succinate (TOPROL-XL) 50 MG XL tablet Take 1 tablet (50 mg total) by mouth once daily 90 tablet 3   Allergies:  Shellfish Containing Products Other (Aggravates gout)   Past Medical History:  Cardiac murmur  Hypercholesterolemia 05/14/2013  Hypertension   Past Surgical History:  EXTRACTION CATARACT EXTRACAPSULAR W/INSERTION INTRAOCULAR PROSTHESIS Bilateral (2021)   Family History:  Breast cancer Mother  Coronary Artery Disease (Blocked arteries around heart) Father 11  High blood pressure (Hypertension) Father  COPD Brother   Social History:   Socioeconomic History:  Marital status: Married  Tobacco Use  Smoking status: Former  Smokeless tobacco: Never  Advertising account planner  Vaping status: Never Used  Substance and Sexual Activity  Alcohol use: Yes  Drug use: No  Sexual activity: Yes  Partners: Female  Social History Narrative  Married. Daughter and grandchildren at home. Works for [employer], Naval architect work. Hobbies- golf.   Social Determinants of Health:  Food Insecurity: No Food Insecurity (06/04/2021)  Received from [facility]  Hunger Vital Sign  Worried About Running Out of Food in the Last Year: Never true  Ran Out of Food in the Last Year: Never true   Review of Systems:  A comprehensive 14 point ROS was performed, reviewed, and the pertinent orthopaedic findings are documented in the HPI.  Physical Exam: Vitals:  10/22/22 1033  BP: (!) 160/102  Weight: (!) 109.1 kg (240 lb 9.6 oz)  Height: 185.4 cm (6\' 1" )  PainSc: 3  PainLoc: Knee   General/Constitutional: The patient appears to be well-nourished, well-developed, and in no acute distress. Neuro/Psych: Normal mood and affect, oriented to person, place and time. Eyes: Non-icteric. Pupils  are equal, round, and reactive to light, and exhibit synchronous movement. Lymphatic: No palpable adenopathy. Respiratory: Lungs clear to auscultation, Normal chest  excursion, No wheezes, and Non-labored breathing Cardiovascular: Regular rate and rhythm with a 2+ systolic ejection murmur and No edema, swelling or tenderness, except as noted in detailed exam. Vascular: No edema, swelling or tenderness, except as noted in detailed exam. Integumentary: No impressive skin lesions present, except as noted in detailed exam. Musculoskeletal: Unremarkable, except as noted in detailed exam.  Left knee exam: GAIT: normal and uses no assistive devices. ALIGNMENT: normal SKIN: unremarkable SWELLING: minimal EFFUSION: small WARMTH: no warmth TENDERNESS: moderate tenderness along medial joint line, mild tenderness along lateral joint line ROM: 0 to 100 degrees with pain in maximal flexion McMURRAY'S: positive PATELLOFEMORAL: normal tracking with no peri-patellar tenderness and negative apprehension sign CREPITUS: mild patellofemoral crepitance LACHMAN'S: negative PIVOT SHIFT: negative ANTERIOR DRAWER: negative POSTERIOR DRAWER: negative VARUS/VALGUS: positive pseudolaxity to varus stressing  He is neurovascularly intact to the right lower extremity and foot.  Knee Imaging: Recent AP weightbearing of both knees, as well as lateral and merchant views of the left knee are available for review and have been reviewed by myself. These films demonstrate mild-moderate degenerative changes, primarily involving the medial compartment with 20% medial joint space narrowing and medial and lateral femoral and tibial osteophyte formation. Overall alignment is neutral. No fractures, lytic lesions, or abnormal calcifications are noted.  Knee Imaging, external: Left knee: A recent MRI scan of the left knee is available for review and has been reviewed by myself. By report, this study demonstrates evidence of a complex degenerative tear involving the posterior medial portion of the medial meniscus with a primarily oblique component to it. There is significant degenerative changes  involving both the femoral and tibial weightbearing portions of the medial compartment, as well as "mild-moderate" degenerative changes of the patellofemoral compartment and mild degenerative changes of the lateral compartment. There is mucoid degeneration of the ACL without clear evidence of tearing. Both the films and report were reviewed by myself and discussed with the patient.  Assessment:  1. Primary osteoarthritis of left knee.  2. Class 1 obesity with body mass index (BMI) of 33.0 to 33.9 in adult.  Plan: The treatment options were discussed with the patient and his wife. In addition, patient educational materials were provided regarding the diagnosis and treatment options. The patient is quite frustrated by his symptoms and functional limitations and is ready to consider more aggressive treatment options. Based on his MRI findings, I do not feel that an arthroscopic procedure would be sufficient, given the severity of some of his arthritic changes. Furthermore, there is sufficient or arthritic changes involving the patellofemoral more so than the lateral compartments that I do not feel that a partial knee replacement would be successful in his case in the long-term. Therefore, I have recommended a surgical procedure, specifically a left total knee arthroplasty. All of the patient's questions and concerns were answered. He can call any time with further concerns. He will return to work without restrictions. He will follow up post-surgery,    H&P reviewed and patient re-examined. No changes.

## 2022-10-29 NOTE — Transfer of Care (Signed)
Immediate Anesthesia Transfer of Care Note  Patient: Anthony Olsen  Procedure(s) Performed: TOTAL KNEE ARTHROPLASTY - RNFA (Left: Knee)  Patient Location: PACU  Anesthesia Type:Spinal  Level of Consciousness: drowsy  Airway & Oxygen Therapy: Patient Spontanous Breathing and Patient connected to nasal cannula oxygen  Post-op Assessment: Report given to RN and Post -op Vital signs reviewed and stable  Post vital signs: Reviewed and stable  Last Vitals:  Vitals Value Taken Time  BP 105/69 10/29/22 1006  Temp    Pulse 66 10/29/22 1009  Resp 11 10/29/22 1009  SpO2 94 % 10/29/22 1009  Vitals shown include unvalidated device data.  Last Pain:  Vitals:   10/29/22 0624  TempSrc: Oral  PainSc: 0-No pain      Patients Stated Pain Goal: 0 (10/29/22 1610)  Complications: No notable events documented.

## 2022-10-29 NOTE — Progress Notes (Signed)
Physical Therapy Evaluation Patient Details Name: Anthony Olsen MRN: [id number] DOB: 1961/02/08 Today's Date: 10/29/2022  History of Present Illness  Anthony Olsen is a 62 y.o. male who is s/p left total knee arthroplasty. Pertinent PMH includes: aortic stenosis, diastolic dysfunction, IRBBB, HTN, HLD, GERD (no daily Tx), OA, tear of LEFT medial meniscus. At time of evaluation, patient is POD 0.  Clinical Impression  Patient is a pleasant 62 y.o. male s/p L TKA, is POD 0 at time of evaluation. Patient received supine in bed and agreeable to therapy session. PT reviewing exercise program with patient and providing extensive education on precautions and positioning. Patient was able to complete 10 straight leg raises, therefore no knee immobilizer warranted. Patient able to complete bed mobility Mod I. With sit to stand and transfers patient is requiring use of RW and CGA at this time. Patient was able to ambulate 150 ft with CGA and use of RW, as well as complete single step for home entry with CGA. Did provide education on importance use of RW at this time to promote safety. Patient has met all PT goals at this time and d/c in house.      Recommendations for follow up therapy are one component of a multi-disciplinary discharge planning process, led by the attending physician.  Recommendations may be updated based on patient status, additional functional criteria and insurance authorization.  Follow Up Recommendations       Assistance Recommended at Discharge Intermittent Supervision/Assistance  Patient can return home with the following  A little help with walking and/or transfers;A little help with bathing/dressing/bathroom;Assist for transportation;Help with stairs or ramp for entrance    Equipment Recommendations Rolling walker (2 wheels);BSC/3in1  Recommendations for Other Services       Functional Status Assessment Patient has had a recent decline in their functional status and  demonstrates the ability to make significant improvements in function in a reasonable and predictable amount of time.     Precautions / Restrictions Precautions Precautions: Knee Precaution Booklet Issued: Yes (comment) Restrictions Weight Bearing Restrictions: Yes LLE Weight Bearing: Weight bearing as tolerated      Mobility  Bed Mobility Overal bed mobility: Modified Independent             General bed mobility comments: increased time required for completion, but able to complete without assistance.    Transfers Overall transfer level: Needs assistance Equipment used: Rolling walker (2 wheels) Transfers: Sit to/from Stand, Bed to chair/wheelchair/BSC Sit to Stand: Min guard Stand pivot transfers: Min guard         General transfer comment: CGA with transfers and use of RW due to weakness/pain in L Knee.    Ambulation/Gait Ambulation/Gait assistance: Min guard Gait Distance (Feet): 150 Feet Assistive device: Rolling walker (2 wheels) Gait Pattern/deviations: Step-through pattern          Stairs Stairs: Yes Stairs assistance: Min guard Stair Management: No rails, With walker Number of Stairs: 1 General stair comments: practiced single step entry, no rails, with use of RW. PT demonstrating/educating on sequencing and use of RW. Patient able to return with minimal cues and CGA.  Wheelchair Mobility    Modified Rankin (Stroke Patients Only)       Balance Overall balance assessment: Needs assistance Sitting-balance support: Feet supported Sitting balance-Leahy Scale: Normal     Standing balance support: Bilateral upper extremity supported, Reliant on assistive device for balance, During functional activity Standing balance-Leahy Scale: Good  Pertinent Vitals/Pain Pain Assessment Pain Assessment: 0-10 Pain Score: 4  Pain Location: Left Knee Pain Descriptors / Indicators: Sore Pain Intervention(s):  Monitored during session    Home Living Family/patient expects to be discharged to:: Private residence Living Arrangements: Spouse/significant other Available Help at Discharge: Family Type of Home: House Home Access: Stairs to enter Entrance Stairs-Rails: None Entrance Stairs-Number of Steps: 1   Home Layout: One level        Prior Function Prior Level of Function : Independent/Modified Independent;Working/employed;Driving             Mobility Comments: Was IND prior without use of AD, was working full time and driving.       Hand Dominance        Extremity/Trunk Assessment                Communication   Communication: No difficulties  Cognition Arousal/Alertness: Awake/alert Behavior During Therapy: WFL for tasks assessed/performed Overall Cognitive Status: Within Functional Limits for tasks assessed                                          General Comments      Exercises Total Joint Exercises Straight Leg Raises: AROM, Strengthening, Left, 10 reps, Supine Goniometric ROM: 0-92 Other Exercises Other Exercises: Reviewed TKA Handout with patients. Educated on [place] status, positioning, and exercises to complete to promote strength/ROM. PT also provided education on polar care, donn/doff compression stockings, and safety with mobility within the home.   Assessment/Plan    PT Assessment All further PT needs can be met in the next venue of care (all needs can be meet with Home Health PT services)  PT Problem List Decreased range of motion;Decreased activity tolerance;Decreased mobility;Decreased balance;Pain       PT Treatment Interventions      PT Goals (Current goals can be found in the Care Plan section)  Acute Rehab PT Goals Patient Stated Goal: "get out the door" PT Goal Formulation: With patient Time For Goal Achievement: 10/29/22 Potential to Achieve Goals: Good    Frequency       Co-evaluation                AM-PAC PT "6 Clicks" Mobility  Outcome Measure Help needed turning from your back to your side while in a flat bed without using bedrails?: None Help needed moving from lying on your back to sitting on the side of a flat bed without using bedrails?: None Help needed moving to and from a bed to a chair (including a wheelchair)?: A Little Help needed standing up from a chair using your arms (e.g., wheelchair or bedside chair)?: A Little Help needed to walk in hospital room?: A Little Help needed climbing 3-5 steps with a railing? : A Little 6 Click Score: 20    End of Session Equipment Utilized During Treatment: Gait belt Activity Tolerance: Patient tolerated treatment well Patient left: in bed;with family/visitor present Nurse Communication: Mobility status PT Visit Diagnosis: Muscle weakness (generalized) (M62.81);Difficulty in walking, not elsewhere classified (R26.2)    Time: 6962-9528 PT Time Calculation (min) (ACUTE ONLY): 28 min   Charges:   PT Evaluation $PT Eval Low Complexity: 1 Low PT Treatments $Gait Training: 8-22 mins       Howie Ill, PT, DPT 10/29/22 4:12 PM

## 2022-10-29 NOTE — Discharge Instructions (Addendum)
Orthopedic discharge instructions: May shower with intact OpSite dressing. Apply ice frequently to knee or use Polar Care. Start Eliquis 1 tablet (2.5 mg) twice daily on Friday, 10/30/2022, for 2 weeks, then take aspirin 325 mg twice daily for 4 weeks. Take pain medication as prescribed when needed.  May supplement with ES Tylenol if necessary. May weight-bear as tolerated on left leg - use walker for balance and support. Follow-up in 10-14 days or as scheduled.  AMBULATORY SURGERY  DISCHARGE INSTRUCTIONS   The drugs that you were given will stay in your system until tomorrow so for the next 24 hours you should not:  Drive an automobile Make any legal decisions Drink any alcoholic beverage   You may resume regular meals tomorrow.  Today it is better to start with liquids and gradually work up to solid foods.  You may eat anything you prefer, but it is better to start with liquids, then soup and crackers, and gradually work up to solid foods.   Please notify your doctor immediately if you have any unusual bleeding, trouble breathing, redness and pain at the surgery site, drainage, fever, or pain not relieved by medication.    Additional Instructions:        Please contact your physician with any problems or Same Day Surgery at [phone], Monday through Friday 6 am to 4 pm, or [place] at [facility] number at [phone].  Information for Discharge Teaching: EXPAREL (bupivacaine liposome injectable suspension)   Your surgeon or anesthesiologist gave you EXPAREL(bupivacaine) to help control your pain after surgery.  EXPAREL is a local anesthetic that provides pain relief by numbing the tissue around the surgical site. EXPAREL is designed to release pain medication over time and can control pain for up to 72 hours. Depending on how you respond to EXPAREL, you may require less pain medication during your recovery.  Possible side effects: Temporary loss of sensation  or ability to move in the area where bupivacaine was injected. Nausea, vomiting, constipation Rarely, numbness and tingling in your mouth or lips, lightheadedness, or anxiety may occur. Call your doctor right away if you think you may be experiencing any of these sensations, or if you have other questions regarding possible side effects.  Follow all other discharge instructions given to you by your surgeon or nurse. Eat a healthy diet and drink plenty of water or other fluids.  If you return to the hospital for any reason within 96 hours following the administration of EXPAREL, it is important for health care providers to know that you have received this anesthetic. A teal colored band has been placed on your arm with the date, time and amount of EXPAREL you have received in order to alert and inform your health care providers. Please leave this armband in place for the full 96 hours following administration, and then you may remove the band.  POLAR CARE INFORMATION  MassAdvertisement.it  How to use [employer] Polar Care [facility] Therapy System?  YouTube   ShippingScam.co.uk  OPERATING INSTRUCTIONS  Start the product With dry hands, connect the transformer to the electrical connection located on the top of the cooler. Next, plug the transformer into an appropriate electrical outlet. The unit will automatically start running at this point.  To stop the pump, disconnect electrical power.  Unplug to stop the product when not in use. Unplugging the Polar Care unit turns it off. Always unplug immediately after use. Never leave it plugged in while unattended. Remove pad.  FIRST ADD WATER TO FILL LINE, THEN ICE---Replace ice when existing ice is almost melted  1 Discuss Treatment with your Licensed Health Care Practitioner and Use Only as Prescribed 2 Apply Insulation Barrier & Cold Therapy Pad 3 Check for Moisture 4 Inspect Skin Regularly  Tips and Trouble Shooting Usage  Tips 1. Use cubed or chunked ice for optimal performance. 2. It is recommended to drain the Pad between uses. To drain the pad, hold the Pad upright with the hose pointed toward the ground. Depress the black plunger and allow water to drain out. 3. You may disconnect the Pad from the unit without removing the pad from the affected area by depressing the silver tabs on the hose coupling and gently pulling the hoses apart. The Pad and unit will seal itself and will not leak. Note: Some dripping during release is normal. 4. DO NOT RUN PUMP WITHOUT WATER! The pump in this unit is designed to run with water. Running the unit without water will cause permanent damage to the pump. 5. Unplug unit before removing lid.  TROUBLESHOOTING GUIDE Pump not running, Water not flowing to the pad, Pad is not getting cold 1. Make sure the transformer is plugged into the wall outlet. 2. Confirm that the ice and water are filled to the indicated levels. 3. Make sure there are no kinks in the pad. 4. Gently pull on the blue tube to make sure the tube/pad junction is straight. 5. Remove the pad from the treatment site and ll it while the pad is lying at; then reapply. 6. Confirm that the pad couplings are securely attached to the unit. Listen for the double clicks (Figure 1) to confirm the pad couplings are securely attached.  Leaks    Note: Some condensation on the lines, controller, and pads is unavoidable, especially in warmer climates. 1. If using a Breg Polar Care Cold Therapy unit with a detachable Cold Therapy Pad, and a leak exists (other than condensation on the lines) disconnect the pad couplings. Make sure the silver tabs on the couplings are depressed before reconnecting the pad to the pump hose; then confirm both sides of the coupling are properly clicked in. 2. If the coupling continues to leak or a leak is detected in the pad itself, stop using it and call [employer] Customer Care at [phone]  [phone].  Cleaning After use, empty and dry the unit with a soft cloth. Warm water and mild detergent may be used occasionally to clean the pump and tubes.  WARNING: The Polar Care Cube can be cold enough to cause serious injury, including full skin necrosis. Follow these Operating Instructions, and carefully read the Product Insert (see pouch on side of unit) and the Cold Therapy Pad Fitting Instructions (provided with each Cold Therapy Pad) prior to use.

## 2022-10-29 NOTE — Anesthesia Preprocedure Evaluation (Signed)
Anesthesia Evaluation  Patient identified by MRN, date of birth, ID band Patient awake    Reviewed: Allergy & Precautions, H&P , NPO status , Patient's Chart, lab work & pertinent test results, reviewed documented beta blocker date and time   History of Anesthesia Complications Negative for: history of anesthetic complications  Airway Mallampati: II  TM Distance: >3 FB Neck ROM: full    Dental  (+) Dental Advidsory Given, Missing, Teeth Intact   Pulmonary neg pulmonary ROS   Pulmonary exam normal breath sounds clear to auscultation       Cardiovascular Exercise Tolerance: Good hypertension, (-) angina (-) CAD, (-) Past MI and (-) Cardiac Stents Normal cardiovascular exam+ dysrhythmias (incomplete RBBB) + Valvular Problems/Murmurs (mild AS) AS  Rhythm:regular Rate:Normal     Neuro/Psych negative neurological ROS  negative psych ROS   GI/Hepatic Neg liver ROS,GERD  ,,  Endo/Other  negative endocrine ROS    Renal/GU negative Renal ROS  negative genitourinary   Musculoskeletal   Abdominal   Peds  Hematology negative hematology ROS (+)   Anesthesia Other Findings Past Medical History: 09/24/2022: Aortic stenosis     Comment:  a.) TTE 09/24/2022: mild AS with MPD 17.9; AVA (VTI) =               1.2 cm^2 No date: Complex tear of medial meniscus of left knee No date: Diastolic dysfunction     Comment:  a.) TTE 09/24/2022: EF >55%, mild BAE, mild RVE. triv               PR, mild AR/MR/TR, G1DD No date: GERD (gastroesophageal reflux disease) No date: Gout No date: Heart murmur 11/02/2018: History of 2019 novel coronavirus disease (COVID-19) No date: History of bilateral cataract extraction No date: Hyperlipidemia No date: Hypertension No date: Incomplete right bundle branch block (RBBB) No date: OA (osteoarthritis)   Reproductive/Obstetrics negative OB ROS                              Anesthesia Physical Anesthesia Plan  ASA: 2  Anesthesia Plan: Spinal   Post-op Pain Management:    Induction: Intravenous  PONV Risk Score and Plan: 1 and Propofol infusion and TIVA  Airway Management Planned: Natural Airway and Simple Face Mask  Additional Equipment:   Intra-op Plan:   Post-operative Plan:   Informed Consent: I have reviewed the patients History and Physical, chart, labs and discussed the procedure including the risks, benefits and alternatives for the proposed anesthesia with the patient or authorized representative who has indicated his/her understanding and acceptance.     Dental Advisory Given  Plan Discussed with: Anesthesiologist, CRNA and Surgeon  Anesthesia Plan Comments:        Anesthesia Quick Evaluation

## 2022-10-29 NOTE — Progress Notes (Signed)
Patient is not able to walk the distance required to go the bathroom, or he/she is unable to safely negotiate stairs required to access the bathroom.  A 3in1 BSC will alleviate this problem  

## 2022-10-29 NOTE — Anesthesia Procedure Notes (Addendum)
Spinal  Patient location during procedure: OR Start time: 10/29/2022 7:38 AM End time: 10/29/2022 7:39 AM Reason for block: surgical anesthesia Staffing Performed: resident/CRNA  Anesthesiologist: Lenard Simmer, MD Resident/CRNA: Monico Hoar, CRNA Performed by: Monico Hoar, CRNA Authorized by: Lenard Simmer, MD   Preanesthetic Checklist Completed: patient identified, IV checked, site marked, risks and benefits discussed, surgical consent, monitors and equipment checked, pre-op evaluation and timeout performed Spinal Block Patient position: sitting Prep: ChloraPrep Patient monitoring: heart rate, cardiac monitor, continuous pulse ox and blood pressure Approach: midline Location: L3-4 Injection technique: single-shot Needle Needle type: Pencan  Needle gauge: 24 G Needle length: 10 cm Needle insertion depth: 7 cm Assessment Sensory level: T6 Events: CSF return Additional Notes 3 ml of 0.5% bupiv injected. No complications noted

## 2022-10-29 NOTE — Op Note (Signed)
10/29/2022  10:14 AM  Patient:   Anthony Olsen  Pre-Op Diagnosis:   Degenerative joint disease, left knee.  Post-Op Diagnosis:   Same  Procedure:   Left TKA using all-cemented Biomet Vanguard system with a 70 mm PCR femur, a 75 mm tibial tray with a 10 mm anterior stabilized E-poly insert, and a 37 x 8.6 mm all-poly 3-pegged domed patella.  Surgeon:   Maryagnes Amos, MD  Assistant:   Griffin Basil, RNFA   Anesthesia:   Spinal  Findings:   As above  Complications:   None  EBL:   10 cc  Fluids:   400 cc crystalloid  UOP:   None  TT:   110 minutes at 300 mmHg  Drains:   None  Closure:   Staples  Implants:   As above  Brief Clinical Note:   The patient is a 62 year old male with a several year of progressively worsening left knee pain. The patient's symptoms have progressed despite medications, activity modification, injections, etc. The patient's history and examination were consistent with advanced degenerative joint disease of the left knee confirmed by plain radiographs. The patient presents at this time for a left total knee arthroplasty.  Procedure:   The patient was brought into the operating room. After adequate spinal anesthesia was obtained, the patient was lain in the supine position before the left lower extremity was prepped with ChloraPrep solution and draped sterilely. Preoperative antibiotics were administered. A timeout was performed to verify the appropriate surgical site before the limb was exsanguinated with an Esmarch and the tourniquet inflated to 300 mmHg.   A standard anterior approach to the knee was made through an approximately 7 inch incision. The incision was carried down through the subcutaneous tissues to expose superficial retinaculum. This was split the length of the incision and the medial flap elevated sufficiently to expose the medial retinaculum. The medial retinaculum was incised, leaving a 3-4 mm cuff of tissue on the patella. This was  extended distally along the medial border of the patellar tendon and proximally through the medial third of the quadriceps tendon. A subtotal fat pad excision was performed before the soft tissues were elevated off the anteromedial and anterolateral aspects of the proximal tibia to the level of the collateral ligaments. The anterior portions of the medial and lateral menisci were removed, as was the anterior cruciate ligament. With the knee flexed to 90, the external tibial guide was positioned and the appropriate proximal tibial cut made. This piece was taken to the back table where it was measured and found to be optimally replicated by a 75 mm component.  Attention was directed to the distal femur. The intramedullary canal was accessed through a 3/8" drill hole. The intramedullary guide was inserted and positioned in order to obtain a neutral flexion gap. The intercondylar block was positioned with care taken to avoid notching the anterior cortex of the femur. The appropriate cut was made. Next, the distal cutting block was placed at 5 of valgus alignment. Using the 9 mm slot, the distal cut was made. The distal femur was measured and found to be optimally replicated by the 70 mm component. The 70 mm 4-in-1 cutting block was positioned and first the posterior, then the posterior chamfer, the anterior chamfer, and finally the anterior cuts were made. At this point, the posterior portions medial and lateral menisci were removed. A trial reduction was performed using the appropriate femoral and tibial components with the 10 mm insert. This  demonstrated excellent stability to varus and valgus stressing both in flexion and extension while permitting full extension. Patella tracking was assessed and found to be excellent. Therefore, the tibial guide position was marked on the proximal tibia. The patella thickness was measured and found to be 22 mm. Therefore, the appropriate cut was made. The patellar surface was  measured and found to be optimally replicated by the 37 mm component. The three peg holes were drilled in place before the trial button was inserted. Patella tracking was assessed and found to be excellent, passing the "no thumb test". The lug holes were drilled into the distal femur before the trial component was removed, leaving only the tibial tray. The keel was then created using the appropriate tower, reamer, and punch.  The bony surfaces were prepared for cementing by irrigating them thoroughly with sterile saline solution via the jet lavage system. A bone plug was fashioned from some of the bone that had been removed previously and used to plug the distal femoral canal. In addition, a "cocktail" of 20 cc of Exparel, 30 cc of 0.5% Sensorcaine, 2 cc of Kenalog 40 (80 mg), and 30 mg of Toradol diluted out to 90 cc with normal saline was injected into the postero-medial and postero-lateral aspects of the knee, the medial and lateral gutter regions, and the peri-incisional tissues to help with postoperative analgesia. Meanwhile, the cement was being mixed on the back table. When it was ready, the tibial tray was cemented in first. The excess cement was removed using Personal assistant. Next, the femoral component was impacted into place. Again, the excess cement was removed using Personal assistant. The 10 mm trial insert was positioned and the knee brought into extension while the cement hardened. Finally, the patella was cemented into place and secured using the patellar clamp. Again, the excess cement was removed using Personal assistant. Once the cement had hardened, the knee was placed through a range of motion with the findings as described above. Therefore, the trial insert was removed and, after verifying that no cement had been retained posteriorly, the permanent 10 mm anterior stabilized E-polyethylene insert was positioned and secured using the appropriate key locking mechanism. Again the knee was placed  through a range of motion with the findings as described above.  The wound was copiously irrigated with sterile saline solution using the jet lavage system before the quadriceps tendon and retinacular layer were reapproximated using #0 Vicryl interrupted sutures. The superficial retinacular layer also was closed using a running #0 Vicryl suture. A total of 10 cc of transexemic acid (TXA) was injected intra-articularly before the subcutaneous tissues were closed in several layers using 2-0 Vicryl interrupted sutures. The skin was closed using staples. A sterile honeycomb dressing was applied to the skin before the leg was wrapped with an Ace wrap to accommodate the Polar Care device. The patient was then awakened and returned to the recovery room in satisfactory condition after tolerating the procedure well.

## 2022-10-30 ENCOUNTER — Encounter: Payer: Self-pay | Admitting: Surgery

## 2022-10-31 NOTE — Anesthesia Postprocedure Evaluation (Signed)
Anesthesia Post Note  Patient: Anthony Olsen  Procedure(s) Performed: TOTAL KNEE ARTHROPLASTY - RNFA (Left: Knee)  Patient location during evaluation: PACU Anesthesia Type: Spinal Level of consciousness: oriented and awake and alert Pain management: pain level controlled Vital Signs Assessment: post-procedure vital signs reviewed and stable Respiratory status: spontaneous breathing and respiratory function stable Cardiovascular status: blood pressure returned to baseline and stable Postop Assessment: no headache, no backache, no apparent nausea or vomiting and patient able to bend at knees Anesthetic complications: no   No notable events documented.   Last Vitals:  Vitals:   10/29/22 1115 10/29/22 1131  BP: 122/68 (!) 142/74  Pulse: 64 (!) 59  Resp: 14 15  Temp:  36.6 C  SpO2: 97% 98%    Last Pain:  Vitals:   10/29/22 1131  TempSrc: Temporal  PainSc:                  Lenard Simmer

## 2022-12-07 ENCOUNTER — Inpatient Hospital Stay: Admission: RE | Admit: 2022-12-07 | Payer: Managed Care, Other (non HMO) | Source: Ambulatory Visit

## 2023-09-02 ENCOUNTER — Observation Stay
Admission: EM | Admit: 2023-09-02 | Discharge: 2023-09-04 | Disposition: A | Discharge status: Home / Self Care | Attending: Surgery | Admitting: Surgery

## 2023-09-02 ENCOUNTER — Other Ambulatory Visit: Payer: Self-pay

## 2023-09-02 ENCOUNTER — Emergency Department

## 2023-09-02 DIAGNOSIS — Z79899 Other long term (current) drug therapy: Secondary | ICD-10-CM | POA: Insufficient documentation

## 2023-09-02 DIAGNOSIS — K8012 Calculus of gallbladder with acute and chronic cholecystitis without obstruction: Principal | ICD-10-CM | POA: Insufficient documentation

## 2023-09-02 DIAGNOSIS — K8020 Calculus of gallbladder without cholecystitis without obstruction: Principal | ICD-10-CM

## 2023-09-02 DIAGNOSIS — Z8616 Personal history of COVID-19: Secondary | ICD-10-CM | POA: Diagnosis not present

## 2023-09-02 DIAGNOSIS — Z7901 Long term (current) use of anticoagulants: Secondary | ICD-10-CM | POA: Insufficient documentation

## 2023-09-02 DIAGNOSIS — Z96652 Presence of left artificial knee joint: Secondary | ICD-10-CM | POA: Diagnosis not present

## 2023-09-02 DIAGNOSIS — R1013 Epigastric pain: Secondary | ICD-10-CM | POA: Diagnosis present

## 2023-09-02 DIAGNOSIS — K810 Acute cholecystitis: Secondary | ICD-10-CM | POA: Diagnosis present

## 2023-09-02 DIAGNOSIS — I10 Essential (primary) hypertension: Secondary | ICD-10-CM | POA: Insufficient documentation

## 2023-09-02 LAB — BASIC METABOLIC PANEL
Anion gap: 10 (ref 5–15)
BUN: 14 mg/dL (ref 8–23)
CO2: 28 mmol/L (ref 22–32)
Calcium: 9.6 mg/dL (ref 8.9–10.3)
Chloride: 101 mmol/L (ref 98–111)
Creatinine, Ser: 0.86 mg/dL (ref 0.61–1.24)
GFR, Estimated: >60 mL/min (ref >60)
Glucose, Bld: 132 mg/dL — ABNORMAL HIGH (ref 70–99)
Potassium: 4.2 mmol/L (ref 3.5–5.1)
Sodium: 139 mmol/L (ref 135–145)

## 2023-09-02 LAB — LIPASE, BLOOD: Lipase: 33 U/L (ref 11–51)

## 2023-09-02 LAB — CBC
HCT: 42.7 % (ref 39.0–52.0)
Hemoglobin: 15 g/dL (ref 13.0–17.0)
MCH: 30.6 pg (ref 26.0–34.0)
MCHC: 35.1 g/dL (ref 30.0–36.0)
MCV: 87.1 fL (ref 80.0–100.0)
Platelets: 198 10*3/uL (ref 150–400)
RBC: 4.9 MIL/uL (ref 4.22–5.81)
RDW: 12.5 % (ref 11.5–15.5)
WBC: 7.5 10*3/uL (ref 4.0–10.5)
nRBC: 0 % (ref 0.0–0.2)

## 2023-09-02 LAB — HEPATIC FUNCTION PANEL
ALT: 19 U/L (ref 0–44)
AST: 19 U/L (ref 15–41)
Albumin: 4.5 g/dL (ref 3.5–5.0)
Alkaline Phosphatase: 51 U/L (ref 38–126)
Bilirubin, Direct: 0.1 mg/dL (ref 0.0–0.2)
Indirect Bilirubin: 0.9 mg/dL (ref 0.3–0.9)
Total Bilirubin: 1 mg/dL (ref 0.0–1.2)
Total Protein: 7.8 g/dL (ref 6.5–8.1)

## 2023-09-02 LAB — TROPONIN I (HIGH SENSITIVITY)
Troponin I (High Sensitivity): 7 ng/L (ref <18)
Troponin I (High Sensitivity): 8 ng/L (ref <18)

## 2023-09-02 MED ORDER — LIDOCAINE VISCOUS HCL 2 % MT SOLN
15.0000 mL | Freq: Once | OROMUCOSAL | Status: AC
  Administered 2023-09-02: 15 mL via ORAL
  Filled 2023-09-02: qty 15

## 2023-09-02 MED ORDER — MORPHINE SULFATE (PF) 4 MG/ML IV SOLN
4.0000 mg | Freq: Once | INTRAVENOUS | Status: AC
  Administered 2023-09-02: 4 mg via INTRAVENOUS
  Filled 2023-09-02: qty 1

## 2023-09-02 MED ORDER — ALUM & MAG HYDROXIDE-SIMETH 200-200-20 MG/5ML PO SUSP
30.0000 mL | Freq: Once | ORAL | Status: AC
  Administered 2023-09-02: 30 mL via ORAL
  Filled 2023-09-02: qty 30

## 2023-09-02 MED ORDER — HYDROMORPHONE HCL 1 MG/ML IJ SOLN
1.0000 mg | Freq: Once | INTRAMUSCULAR | Status: AC
  Administered 2023-09-02: 1 mg via INTRAVENOUS
  Filled 2023-09-02: qty 1

## 2023-09-02 NOTE — ED Triage Notes (Signed)
 Pt to ED For chest pain started this am with emesis.

## 2023-09-02 NOTE — ED Notes (Signed)
 [place] at bedside

## 2023-09-02 NOTE — ED Notes (Signed)
 Green top sent to [employer], md notified of pt's pain and BP. See [facility]

## 2023-09-02 NOTE — ED Provider Notes (Signed)
 [facility] Provider Note    Event Date/Time   First MD Initiated Contact with Patient 09/02/23 1946     (approximate)   History   Epigastric pain   HPI  Anthony Olsen is a 63 y.o. male who presents to the emergency department today because of concerns for epigastric pain.  Started this morning.  He states that he works the third shift and was able to get home and go to bed although he woke up shortly thereafter.  Says the pain is severe.  It does radiate to his back.  It has been accompanied by nausea.  He denies similar pain in the past.  Denies any fevers. Does use alcohol, last drink 3-4 days ago.    Physical Exam   Triage Vital Signs: ED Triage Vitals  Encounter Vitals Group     BP 09/02/23 1737 (!) 156/89     Systolic BP Percentile --      Diastolic BP Percentile --      Pulse Rate 09/02/23 1737 (!) 48     Resp 09/02/23 1737 20     Temp 09/02/23 1737 97.7 F (36.5 C)     Temp Source 09/02/23 1950 Oral     SpO2 09/02/23 1737 100 %     Weight 09/02/23 1735 237 lb (107.5 kg)     Height 09/02/23 1735 6\' 1"  (1.854 m)     Head Circumference --      Peak Flow --      Pain Score 09/02/23 1735 10     Pain Loc --      Pain Education --      Exclude from Growth Chart --     Most recent vital signs: Vitals:   09/02/23 1737 09/02/23 1950  BP: (!) 156/89 (!) 212/86  Pulse: (!) 48 (!) 56  Resp: 20 20  Temp: 97.7 F (36.5 C) 97.8 F (36.6 C)  SpO2: 100% 100%   General: Awake, alert, oriented. CV:  Good peripheral perfusion. Regular rate and rhythm. Resp:  Normal effort. Lungs clear. Abd:  No distention. Tender to palpation in the epigastric and RUQ.  ED Results / Procedures / Treatments   Labs (all labs ordered are listed, but only abnormal results are displayed) Labs Reviewed  BASIC METABOLIC PANEL - Abnormal; Notable for the following components:      Result Value   Glucose, Bld 132 (*)    All other components within normal  limits  CBC  LIPASE, BLOOD  HEPATIC FUNCTION PANEL  TROPONIN I (HIGH SENSITIVITY)  TROPONIN I (HIGH SENSITIVITY)     EKG  I, Phineas Semen, attending physician, personally viewed and interpreted this EKG  EKG Time: 1734 Rate: 48 Rhythm: sinus bradycardia Axis: normal Intervals: qtc 385 QRS: narrow ST changes: no st elevation Impression: abnormal ekg   RADIOLOGY I independently interpreted and visualized the CXR. My interpretation: No pneumonia Radiology interpretation:  IMPRESSION:  No active cardiopulmonary disease.      PROCEDURES:  Critical Care performed: No   MEDICATIONS ORDERED IN ED: Medications - No data to display   IMPRESSION / MDM / ASSESSMENT AND PLAN / ED COURSE  I reviewed the triage vital signs and the nursing notes.                              Differential diagnosis includes, but is not limited to, pancreatitis, gallbladder disease, peptic ulcer, gastritis  Patient's  presentation is most consistent with acute presentation with potential threat to life or bodily function.   The patient is on the cardiac monitor to evaluate for evidence of arrhythmia and/or significant heart rate changes.  Patient presented to the emergency department today with concerns for epigastric pain.  On exam patient is tender in the epigastric and right upper quadrant.  Blood work sent from triage without concerning abnormality.  Will however add on lipase and LFTs.  Will give pain medication.  LFTs, lipase wnl. Did obtain US RUQ which was concerning for gallstone in neck of gallbladder. Did discuss with Dr. Aleen Campi with surgery given concern for continued pain and US imaging. At this time will wait for official read before admission, however likely will plan on admission for OR tomorrow. Will sign out to oncoming provider.       FINAL CLINICAL IMPRESSION(S) / ED DIAGNOSES   Final diagnoses:  Gallstones       Note:  This document was prepared using Dragon  voice recognition software and may include unintentional dictation errors.    Phineas Semen, MD 09/03/23 [phone]

## 2023-09-02 NOTE — ED Notes (Signed)
 Pt called out requesting something else for pain. Derrill Kay, MD messaged via secure chat.

## 2023-09-02 NOTE — ED Notes (Signed)
 ED Provider at bedside.

## 2023-09-03 ENCOUNTER — Observation Stay: Admitting: Anesthesiology

## 2023-09-03 ENCOUNTER — Encounter: Payer: Self-pay | Admitting: Surgery

## 2023-09-03 ENCOUNTER — Other Ambulatory Visit: Payer: Self-pay

## 2023-09-03 ENCOUNTER — Encounter
Admission: EM | Disposition: A | Discharge status: Home / Self Care | Payer: Self-pay | Source: Home / Self Care | Attending: Emergency Medicine

## 2023-09-03 DIAGNOSIS — K810 Acute cholecystitis: Secondary | ICD-10-CM | POA: Diagnosis not present

## 2023-09-03 LAB — COMPREHENSIVE METABOLIC PANEL
ALT: 18 U/L (ref 0–44)
AST: 20 U/L (ref 15–41)
Albumin: 4 g/dL (ref 3.5–5.0)
Alkaline Phosphatase: 46 U/L (ref 38–126)
Anion gap: 11 (ref 5–15)
BUN: 12 mg/dL (ref 8–23)
CO2: 28 mmol/L (ref 22–32)
Calcium: 9.5 mg/dL (ref 8.9–10.3)
Chloride: 98 mmol/L (ref 98–111)
Creatinine, Ser: 0.89 mg/dL (ref 0.61–1.24)
GFR, Estimated: >60 mL/min (ref >60)
Glucose, Bld: 130 mg/dL — ABNORMAL HIGH (ref 70–99)
Potassium: 3.4 mmol/L — ABNORMAL LOW (ref 3.5–5.1)
Sodium: 137 mmol/L (ref 135–145)
Total Bilirubin: 1.3 mg/dL — ABNORMAL HIGH (ref 0.0–1.2)
Total Protein: 7.4 g/dL (ref 6.5–8.1)

## 2023-09-03 LAB — HIV ANTIBODY (ROUTINE TESTING W REFLEX): HIV Screen 4th Generation wRfx: NONREACTIVE

## 2023-09-03 LAB — CBC
HCT: 42.4 % (ref 39.0–52.0)
Hemoglobin: 15 g/dL (ref 13.0–17.0)
MCH: 31.3 pg (ref 26.0–34.0)
MCHC: 35.4 g/dL (ref 30.0–36.0)
MCV: 88.3 fL (ref 80.0–100.0)
Platelets: 166 10*3/uL (ref 150–400)
RBC: 4.8 MIL/uL (ref 4.22–5.81)
RDW: 12.7 % (ref 11.5–15.5)
WBC: 11.1 10*3/uL — ABNORMAL HIGH (ref 4.0–10.5)
nRBC: 0 % (ref 0.0–0.2)

## 2023-09-03 SURGERY — CHOLECYSTECTOMY, ROBOT-ASSISTED, LAPAROSCOPIC
Anesthesia: General

## 2023-09-03 MED ORDER — LIDOCAINE HCL (PF) 2 % IJ SOLN
INTRAMUSCULAR | Status: AC
  Filled 2023-09-03: qty 5

## 2023-09-03 MED ORDER — PROPOFOL 10 MG/ML IV BOLUS
INTRAVENOUS | Status: AC
  Filled 2023-09-03: qty 40

## 2023-09-03 MED ORDER — DEXAMETHASONE SODIUM PHOSPHATE 10 MG/ML IJ SOLN
INTRAMUSCULAR | Status: AC
  Filled 2023-09-03: qty 1

## 2023-09-03 MED ORDER — PROPOFOL 10 MG/ML IV BOLUS
INTRAVENOUS | Status: AC
  Filled 2023-09-03: qty 20

## 2023-09-03 MED ORDER — INDOCYANINE GREEN 25 MG IV SOLR
1.2500 mg | Freq: Once | INTRAVENOUS | Status: AC
  Administered 2023-09-03: 1.25 mg via INTRAVENOUS
  Filled 2023-09-03: qty 10

## 2023-09-03 MED ORDER — LACTATED RINGERS IV SOLN: INTRAVENOUS | Status: DC | PRN

## 2023-09-03 MED ORDER — OXYCODONE HCL 5 MG PO TABS
5.0000 mg | ORAL_TABLET | ORAL | Status: DC | PRN
  Administered 2023-09-04: 5 mg via ORAL
  Filled 2023-09-03: qty 1

## 2023-09-03 MED ORDER — SUGAMMADEX SODIUM 200 MG/2ML IV SOLN
INTRAVENOUS | Status: AC
  Filled 2023-09-03: qty 2

## 2023-09-03 MED ORDER — FENTANYL CITRATE (PF) 100 MCG/2ML IJ SOLN
INTRAMUSCULAR | Status: AC
  Filled 2023-09-03: qty 2

## 2023-09-03 MED ORDER — LIDOCAINE HCL (CARDIAC) PF 100 MG/5ML IV SOSY
PREFILLED_SYRINGE | INTRAVENOUS | Status: DC | PRN
  Administered 2023-09-03: 100 mg via INTRAVENOUS

## 2023-09-03 MED ORDER — HEPARIN SODIUM (PORCINE) 5000 UNIT/ML IJ SOLN
INTRAMUSCULAR | Status: AC
  Filled 2023-09-03: qty 1

## 2023-09-03 MED ORDER — 0.9 % SODIUM CHLORIDE (POUR BTL) OPTIME
TOPICAL | Status: DC | PRN
  Administered 2023-09-03: 200 mL

## 2023-09-03 MED ORDER — LABETALOL HCL 5 MG/ML IV SOLN
INTRAVENOUS | Status: AC
  Filled 2023-09-03: qty 4

## 2023-09-03 MED ORDER — INDOCYANINE GREEN 25 MG IV SOLR
INTRAVENOUS | Status: AC
  Filled 2023-09-03: qty 10

## 2023-09-03 MED ORDER — DOCUSATE SODIUM 100 MG PO CAPS
100.0000 mg | ORAL_CAPSULE | Freq: Two times a day (BID) | ORAL | 0 refills | Status: DC | PRN
  Filled 2023-09-03: qty 20, 10d supply, fill #0

## 2023-09-03 MED ORDER — HYDROMORPHONE HCL 1 MG/ML IJ SOLN: 0.5000 mg | INTRAMUSCULAR | Status: DC | PRN

## 2023-09-03 MED ORDER — HYDROMORPHONE HCL 1 MG/ML IJ SOLN
0.5000 mg | INTRAMUSCULAR | Status: DC | PRN
  Administered 2023-09-03: 0.5 mg via INTRAVENOUS
  Filled 2023-09-03: qty 0.5

## 2023-09-03 MED ORDER — FENTANYL CITRATE (PF) 100 MCG/2ML IJ SOLN
INTRAMUSCULAR | Status: DC | PRN
  Administered 2023-09-03 (×2): 50 ug via INTRAVENOUS

## 2023-09-03 MED ORDER — PROPOFOL 10 MG/ML IV BOLUS
INTRAVENOUS | Status: DC | PRN
  Administered 2023-09-03: 200 mg via INTRAVENOUS
  Administered 2023-09-03: 50 ug/kg/min via INTRAVENOUS

## 2023-09-03 MED ORDER — ONDANSETRON 4 MG PO TBDP: 4.0000 mg | ORAL_TABLET | Freq: Four times a day (QID) | ORAL | Status: DC | PRN

## 2023-09-03 MED ORDER — OXYCODONE-ACETAMINOPHEN 5-325 MG PO TABS
1.0000 | ORAL_TABLET | Freq: Three times a day (TID) | ORAL | 0 refills | Status: DC | PRN
  Filled 2023-09-03: qty 15, 5d supply, fill #0

## 2023-09-03 MED ORDER — BUPIVACAINE LIPOSOME 1.3 % IJ SUSP
INTRAMUSCULAR | Status: AC
  Filled 2023-09-03: qty 10

## 2023-09-03 MED ORDER — POLYETHYLENE GLYCOL 3350 17 G PO PACK: 17.0000 g | PACK | Freq: Every day | ORAL | Status: DC | PRN

## 2023-09-03 MED ORDER — GLYCOPYRROLATE 0.2 MG/ML IJ SOLN
INTRAMUSCULAR | Status: AC
  Filled 2023-09-03: qty 1

## 2023-09-03 MED ORDER — HYDROMORPHONE HCL 1 MG/ML IJ SOLN: 1.0000 mg | INTRAMUSCULAR | Status: DC | PRN

## 2023-09-03 MED ORDER — PIPERACILLIN-TAZOBACTAM 3.375 G IVPB
INTRAVENOUS | Status: AC
  Filled 2023-09-03: qty 50

## 2023-09-03 MED ORDER — LABETALOL HCL 5 MG/ML IV SOLN
INTRAVENOUS | Status: DC | PRN
Start: 2023-09-03 — End: 2023-09-03
  Administered 2023-09-03 (×2): 5 mg via INTRAVENOUS

## 2023-09-03 MED ORDER — DEXAMETHASONE SODIUM PHOSPHATE 10 MG/ML IJ SOLN
INTRAMUSCULAR | Status: DC | PRN
  Administered 2023-09-03: 10 mg via INTRAVENOUS

## 2023-09-03 MED ORDER — ACETAMINOPHEN 10 MG/ML IV SOLN: 1000.0000 mg | Freq: Once | INTRAVENOUS | Status: DC | PRN

## 2023-09-03 MED ORDER — ONDANSETRON HCL 4 MG/2ML IJ SOLN
INTRAMUSCULAR | Status: AC
  Filled 2023-09-03: qty 2

## 2023-09-03 MED ORDER — SODIUM CHLORIDE 0.9 % IV SOLN: INTRAVENOUS | Status: DC

## 2023-09-03 MED ORDER — PIPERACILLIN-TAZOBACTAM 3.375 G IVPB
3.3750 g | Freq: Three times a day (TID) | INTRAVENOUS | Status: DC
  Administered 2023-09-03 – 2023-09-04 (×4): 3.375 g via INTRAVENOUS
  Filled 2023-09-03 (×3): qty 50

## 2023-09-03 MED ORDER — SUGAMMADEX SODIUM 200 MG/2ML IV SOLN
INTRAVENOUS | Status: DC | PRN
  Administered 2023-09-03: 200 mg via INTRAVENOUS

## 2023-09-03 MED ORDER — BUPIVACAINE-EPINEPHRINE (PF) 0.25% -1:200000 IJ SOLN
INTRAMUSCULAR | Status: AC
  Filled 2023-09-03: qty 30

## 2023-09-03 MED ORDER — MIDAZOLAM HCL 2 MG/2ML IJ SOLN
INTRAMUSCULAR | Status: AC
  Filled 2023-09-03: qty 2

## 2023-09-03 MED ORDER — KETAMINE HCL 50 MG/5ML IJ SOSY
PREFILLED_SYRINGE | INTRAMUSCULAR | Status: DC | PRN
Start: 2023-09-03 — End: 2023-09-03
  Administered 2023-09-03: 10 mg via INTRAVENOUS

## 2023-09-03 MED ORDER — FENTANYL CITRATE (PF) 100 MCG/2ML IJ SOLN
25.0000 ug | INTRAMUSCULAR | Status: DC | PRN
  Administered 2023-09-03 (×2): 25 ug via INTRAVENOUS

## 2023-09-03 MED ORDER — BUPIVACAINE-EPINEPHRINE (PF) 0.25% -1:200000 IJ SOLN
INTRAMUSCULAR | Status: DC | PRN
  Administered 2023-09-03: 40 mL

## 2023-09-03 MED ORDER — KETOROLAC TROMETHAMINE 30 MG/ML IJ SOLN
30.0000 mg | Freq: Four times a day (QID) | INTRAMUSCULAR | Status: DC
  Administered 2023-09-03 – 2023-09-04 (×3): 30 mg via INTRAVENOUS
  Filled 2023-09-03 (×3): qty 1

## 2023-09-03 MED ORDER — GLYCOPYRROLATE 0.2 MG/ML IJ SOLN
INTRAMUSCULAR | Status: DC | PRN
  Administered 2023-09-03: .2 mg via INTRAVENOUS

## 2023-09-03 MED ORDER — KETAMINE HCL 50 MG/5ML IJ SOSY
PREFILLED_SYRINGE | INTRAMUSCULAR | Status: AC
  Filled 2023-09-03: qty 5

## 2023-09-03 MED ORDER — ROCURONIUM BROMIDE 10 MG/ML (PF) SYRINGE
PREFILLED_SYRINGE | INTRAVENOUS | Status: AC
  Filled 2023-09-03: qty 10

## 2023-09-03 MED ORDER — MIDAZOLAM HCL 2 MG/2ML IJ SOLN
INTRAMUSCULAR | Status: DC | PRN
  Administered 2023-09-03: 2 mg via INTRAVENOUS

## 2023-09-03 MED ORDER — LACTATED RINGERS IV SOLN: INTRAVENOUS | Status: DC

## 2023-09-03 MED ORDER — ACETAMINOPHEN 500 MG PO TABS
1000.0000 mg | ORAL_TABLET | Freq: Four times a day (QID) | ORAL | Status: DC
  Administered 2023-09-03 – 2023-09-04 (×4): 1000 mg via ORAL
  Filled 2023-09-03 (×4): qty 2

## 2023-09-03 MED ORDER — AMOXICILLIN-POT CLAVULANATE 875-125 MG PO TABS
1.0000 | ORAL_TABLET | Freq: Two times a day (BID) | ORAL | 0 refills | Status: DC
  Filled 2023-09-03: qty 10, 5d supply, fill #0

## 2023-09-03 MED ORDER — ROCURONIUM BROMIDE 100 MG/10ML IV SOLN
INTRAVENOUS | Status: DC | PRN
  Administered 2023-09-03 (×2): 50 mg via INTRAVENOUS

## 2023-09-03 MED ORDER — HEPARIN 30,000 UNITS/1000 ML (OHS) CELLSAVER SOLUTION
Status: AC
  Filled 2023-09-03: qty 1000

## 2023-09-03 MED ORDER — PANTOPRAZOLE SODIUM 40 MG IV SOLR
40.0000 mg | Freq: Every day | INTRAVENOUS | Status: DC
  Administered 2023-09-03: 40 mg via INTRAVENOUS
  Filled 2023-09-03: qty 10

## 2023-09-03 MED ORDER — ONDANSETRON HCL 4 MG/2ML IJ SOLN
INTRAMUSCULAR | Status: DC | PRN
  Administered 2023-09-03: 4 mg via INTRAVENOUS

## 2023-09-03 MED ORDER — ONDANSETRON HCL 4 MG/2ML IJ SOLN: 4.0000 mg | Freq: Four times a day (QID) | INTRAMUSCULAR | Status: DC | PRN

## 2023-09-03 SURGICAL SUPPLY — 47 items
BAG PRESSURE INF REUSE 1000 (BAG) IMPLANT
CANNULA CAP OBTURATR AIRSEAL 8 (CAP) IMPLANT
CAUTERY HOOK MNPLR 1.6 DVNC XI (INSTRUMENTS) ×1 IMPLANT
CLIP LIGATING HEMO O LOK GREEN (MISCELLANEOUS) ×1 IMPLANT
DERMABOND ADVANCED .7 DNX12 (GAUZE/BANDAGES/DRESSINGS) ×1 IMPLANT
DRAIN CHANNEL JP 19F 1/4 (MISCELLANEOUS) IMPLANT
DRAPE ARM DVNC X/XI (DISPOSABLE) ×4 IMPLANT
DRAPE COLUMN DVNC XI (DISPOSABLE) ×1 IMPLANT
DRSG TEGADERM 4X4.75 (GAUZE/BANDAGES/DRESSINGS) IMPLANT
ELECT CAUTERY BLADE TIP 2.5 (TIP) ×1 IMPLANT
ELECT REM PT RETURN 9FT ADLT (ELECTROSURGICAL) ×1 IMPLANT
ELECTRODE CAUTERY BLDE TIP 2.5 (TIP) ×1 IMPLANT
ELECTRODE REM PT RTRN 9FT ADLT (ELECTROSURGICAL) ×1 IMPLANT
EVACUATOR SILICONE 100CC (DRAIN) IMPLANT
FORCEPS BPLR R/ABLATION 8 DVNC (INSTRUMENTS) ×1 IMPLANT
FORCEPS PROGRASP DVNC XI (FORCEP) ×1 IMPLANT
GLOVE SURG SYN 7.0 (GLOVE) ×2 IMPLANT
GLOVE SURG SYN 7.0 PF PI (GLOVE) ×2 IMPLANT
GLOVE SURG SYN 7.5 E (GLOVE) ×2 IMPLANT
GLOVE SURG SYN 7.5 PF PI (GLOVE) ×2 IMPLANT
GOWN STRL REUS W/ TWL LRG LVL3 (GOWN DISPOSABLE) ×4 IMPLANT
IRRIGATOR SUCT 8 DISP DVNC XI (IRRIGATION / IRRIGATOR) IMPLANT
IV NS 1000ML BAXH (IV SOLUTION) IMPLANT
KIT PINK PAD W/HEAD ARE REST (MISCELLANEOUS) ×1 IMPLANT
KIT PINK PAD W/HEAD ARM REST (MISCELLANEOUS) ×1 IMPLANT
LABEL OR SOLS (LABEL) ×1 IMPLANT
MANIFOLD NEPTUNE II (INSTRUMENTS) ×1 IMPLANT
NDL HYPO 22X1.5 SAFETY MO (MISCELLANEOUS) ×1 IMPLANT
NEEDLE HYPO 22X1.5 SAFETY MO (MISCELLANEOUS) ×1 IMPLANT
NS IRRIG 500ML POUR BTL (IV SOLUTION) ×1 IMPLANT
OBTURATOR OPTICAL STND 8 DVNC (TROCAR) ×1 IMPLANT
OBTURATOR OPTICALSTD 8 DVNC (TROCAR) ×1 IMPLANT
PACK LAP CHOLECYSTECTOMY (MISCELLANEOUS) ×1 IMPLANT
SEAL UNIV 5-12 XI (MISCELLANEOUS) ×4 IMPLANT
SET TUBE FILTERED XL AIRSEAL (SET/KITS/TRAYS/PACK) IMPLANT
SET TUBE SMOKE EVAC HIGH FLOW (TUBING) ×1 IMPLANT
SOL ELECTROSURG ANTI STICK (MISCELLANEOUS) ×1 IMPLANT
SOLUTION ELECTROSURG ANTI STCK (MISCELLANEOUS) ×1 IMPLANT
SPIKE FLUID TRANSFER (MISCELLANEOUS) ×1 IMPLANT
SPONGE VERSALON 4X4 4PLY (MISCELLANEOUS) IMPLANT
SUT ETHILON 2 0 FS 18 (SUTURE) IMPLANT
SUT MNCRL AB 4-0 PS2 18 (SUTURE) ×1 IMPLANT
SUT VIC AB 3-0 SH 27X BRD (SUTURE) IMPLANT
SUT VICRYL 0 UR6 27IN ABS (SUTURE) ×2 IMPLANT
SYS BAG RETRIEVAL 10MM (BASKET) ×1 IMPLANT
SYSTEM BAG RETRIEVAL 10MM (BASKET) ×1 IMPLANT
WATER STERILE IRR 500ML POUR (IV SOLUTION) ×1 IMPLANT

## 2023-09-03 NOTE — H&P (Signed)
 [facility] SURGICAL HISTORY & PHYSICAL (cpt [phone])  HISTORY OF PRESENT ILLNESS (HPI):  63 y.o. male presented to [facility] ED yesterday for abdominal pain. Patient reports the acute onset of epigastric abdominal pain yesterday morning. He initially tried to sleep it off but this worsened. This was accompanied by nausea. No fever, chills, cough, SOB, emesis, urinary changes, bowel changes, nor juandice. No history of similar in the past. No previous intra-abdominal surgeries. Work up in the ED revealed a leukocytosis to 11.1K, Hgb to 15.0, renal function normal with sCr - 0.89, mild hypokalemia to 3.4, mild hyperbilirubinemia to 1.3. RUQ [place] was concerning for cholecystitis.   General surgery consulted by emergency medicine Pilar Jarvis, MD for evaluation and management of acute cholecystitis.   PAST MEDICAL HISTORY (PMH):  Past Medical History:  Diagnosis Date   Aortic stenosis 09/24/2022   a.) TTE 09/24/2022: mild AS with MPD 17.9; AVA (VTI) = 1.2 cm^2   Complex tear of medial meniscus of left knee    Diastolic dysfunction    a.) TTE 09/24/2022: EF >55%, mild BAE, mild RVE. triv PR, mild AR/MR/TR, G1DD   GERD (gastroesophageal reflux disease)    Gout    Heart murmur    History of 2019 novel coronavirus disease (COVID-19) 11/02/2018   History of bilateral cataract extraction    Hyperlipidemia    Hypertension    Incomplete right bundle branch block (RBBB)    OA (osteoarthritis)     Reviewed. Otherwise negative.   PAST SURGICAL HISTORY (PSH):  Past Surgical History:  Procedure Laterality Date   BRONCHOSCOPIC REMOVAL OF FOREIGN BODY  2020   CATARACT EXTRACTION W/PHACO Left 02/12/2020   Procedure: CATARACT EXTRACTION PHACO AND INTRAOCULAR LENS PLACEMENT (IOC) LEFT;  Surgeon: Nevada Crane, MD;  Location: [facility];  Service: Ophthalmology;  Laterality: Left;  4.63 0:30.5   CATARACT EXTRACTION W/PHACO Right 04/15/2020   Procedure: CATARACT EXTRACTION PHACO AND  INTRAOCULAR LENS PLACEMENT (IOC) RIGHT;  Surgeon: Nevada Crane, MD;  Location: [facility];  Service: Ophthalmology;  Laterality: Right;  0.86 0:17.4   COLONOSCOPY     TOTAL KNEE ARTHROPLASTY Left 10/29/2022   Procedure: TOTAL KNEE ARTHROPLASTY - RNFA;  Surgeon: Christena Flake, MD;  Location: [facility] ORS;  Service: Orthopedics;  Laterality: Left;   UPPER GI ENDOSCOPY      Reviewed. Otherwise negative.   MEDICATIONS:  Prior to Admission medications   Medication Sig Start Date End Date Taking? Authorizing Provider  acetaminophen (TYLENOL) 650 MG CR tablet Take 1,300 mg by mouth every 8 (eight) hours as needed for pain.   Yes [provider]  apixaban (ELIQUIS) 2.5 MG TABS tablet Take 1 tablet (2.5 mg total) by mouth 2 (two) times daily. 10/30/22  Yes Poggi, Excell Seltzer, MD  hydrochlorothiazide (HYDRODIURIL) 25 MG tablet Take 1 tablet by mouth daily. 04/23/23 04/22/24 Yes [provider]  HYDROcodone-acetaminophen (NORCO/VICODIN) 5-325 MG tablet Take 1-2 tablets by mouth every 6 (six) hours as needed. 12/01/22  Yes [provider]  lisinopril (ZESTRIL) 40 MG tablet Take 40 mg by mouth daily with lunch.   Yes [provider]  metoprolol succinate (TOPROL-XL) 50 MG 24 hr tablet Take 50 mg by mouth daily with lunch. Take with or immediately following a meal.   Yes [provider]  oxyCODONE (ROXICODONE) 5 MG immediate release tablet Take 1-2 tablets (5-10 mg total) by mouth every 4 (four) hours as needed for moderate pain or severe pain. 10/29/22  Yes Poggi,  Excell Seltzer, MD     ALLERGIES:  Allergies  Allergen Reactions   Shellfish Allergy     Aggravates gout     SOCIAL HISTORY:  Social History   Socioeconomic History   Marital status: Married    Spouse name: Not on file   Number of children: Not on file   Years of education: Not on file   Highest education level: Not on file  Occupational History   Not on file  Tobacco Use   Smoking status: Never    Smokeless tobacco: Never  Vaping Use   Vaping status: Never Used  Substance and Sexual Activity   Alcohol use: Yes    Alcohol/week: 14.0 standard drinks of alcohol    Types: 14 Cans of beer per week    Comment: occ   Drug use: Not on file   Sexual activity: Not on file  Other Topics Concern   Not on file  Social History Narrative   Not on file   Social Drivers of Health   Financial Resource Strain: Not on file  Food Insecurity: No Food Insecurity (06/04/2021)   Received from [facility], [facility]   Hunger Vital Sign    Worried About Running Out of Food in the Last Year: Never true    Ran Out of Food in the Last Year: Never true  Transportation Needs: Not on file  Physical Activity: Not on file  Stress: Not on file  Social Connections: Not on file  Intimate Partner Violence: Not on file     FAMILY HISTORY:  History reviewed. No pertinent family history.  Otherwise negative.   REVIEW OF SYSTEMS:  Review of Systems  Constitutional:  Negative for chills and fever.  Respiratory:  Negative for cough and shortness of breath.   Cardiovascular:  Negative for chest pain and palpitations.  Gastrointestinal:  Positive for abdominal pain and nausea. Negative for diarrhea and vomiting.  All other systems reviewed and are negative.   VITAL SIGNS:  Temp:  [97.7 F (36.5 C)-98.5 F (36.9 C)] 98.2 F (36.8 C) (03/07 0709) Pulse Rate:  [48-99] 69 (03/07 0709) Resp:  [10-26] 18 (03/07 0709) BP: (127-212)/(56-97) 127/56 (03/07 0709) SpO2:  [93 %-100 %] 99 % (03/07 0709) Weight:  [107.5 kg] 107.5 kg (03/06 1735)     Height: 6\' 1"  (185.4 cm) Weight: 107.5 kg BMI (Calculated): 31.27   PHYSICAL EXAM:  Physical Exam Vitals and nursing note reviewed. Exam conducted with a chaperone present.  Constitutional:      General: He is not in acute distress.    Appearance: Normal appearance. He is obese. He is not ill-appearing.  HENT:     Head: Normocephalic and atraumatic.   Eyes:     General: No scleral icterus.    Conjunctiva/sclera: Conjunctivae normal.  Cardiovascular:     Rate and Rhythm: Normal rate.     Pulses: Normal pulses.  Pulmonary:     Effort: Pulmonary effort is normal. No respiratory distress.  Abdominal:     General: Abdomen is flat. There is no distension.     Palpations: Abdomen is soft.     Tenderness: There is abdominal tenderness in the right upper quadrant and epigastric area. There is no guarding or rebound.  Genitourinary:    Comments: Deferred Skin:    General: Skin is warm and dry.     Coloration: Skin is not jaundiced.  Neurological:     General: No focal deficit present.     Mental  Status: He is alert and oriented to person, place, and time.  Psychiatric:        Mood and Affect: Mood normal.        Behavior: Behavior normal.     INTAKE/OUTPUT:  This shift: No intake/output data recorded.  Last 2 shifts: @IOLAST2SHIFTS @  Labs:     Latest Ref Rng & Units 09/03/2023    4:34 AM 09/02/2023    5:35 PM 10/23/2022    9:57 AM  CBC  WBC 4.0 - 10.5 K/uL 11.1  7.5  4.7   Hemoglobin 13.0 - 17.0 g/dL 16.1  09.6  04.5   Hematocrit 39.0 - 52.0 % 42.4  42.7  39.9   Platelets 150 - 400 K/uL 166  198  186       Latest Ref Rng & Units 09/03/2023    4:34 AM 09/02/2023    5:35 PM 10/23/2022    9:57 AM  CMP  Glucose 70 - 99 mg/dL 409  811  914   BUN 8 - 23 mg/dL 12  14  13    Creatinine 0.61 - 1.24 mg/dL 7.82  9.56  2.13   Sodium 135 - 145 mmol/L 137  139  137   Potassium 3.5 - 5.1 mmol/L 3.4  4.2  3.2   Chloride 98 - 111 mmol/L 98  101  102   CO2 22 - 32 mmol/L 28  28  29    Calcium 8.9 - 10.3 mg/dL 9.5  9.6  9.1   Total Protein 6.5 - 8.1 g/dL 7.4  7.8  7.2   Total Bilirubin 0.0 - 1.2 mg/dL 1.3  1.0  1.3   Alkaline Phos 38 - 126 U/L 46  51  39   AST 15 - 41 U/L 20  19  18    ALT 0 - 44 U/L 18  19  20       Imaging studies:   RUQ [place] (09/02/2023) personally reviewed and concerning for acute cholecystitis, and radiologist report  reviewed below: IMPRESSION: Cholelithiasis and gallbladder sludge with mild wall thickening. These changes are consistent with acute cholecystitis.   Assessment/Plan: (ICD-10's: K81.0) 63 y.o. male with acute cholecystitis.    - Admit to general surgery - Plan for robotic assisted laparoscopic cholecystectomy today with Dr Aleen Campi this afternoon pending OR/Anesthesia availability    - All risks, benefits, and alternatives to above procedure(s) were discussed with the patient, all of his questions were answered to his expressed satisfaction, patient expresses he wishes to proceed, and informed consent was obtained.   - NPO for planned procedure; IVF support  - IV Abx (Zosyn)  - ICG on call to OR  - Monitor abdominal examination  - Pain control prn; antiemetics prn  - Mobilize as tolerated   All of the above findings and recommendations were discussed with the patient, and all of his questions were answered to his expressed satisfaction.  -- Lynden Oxford, PA-C [facility] 09/03/2023, 7:57 AM M-F: 7am - 4pm

## 2023-09-03 NOTE — Anesthesia Preprocedure Evaluation (Addendum)
 Anesthesia Evaluation  Patient identified by MRN, date of birth, ID band Patient awake    Reviewed: Allergy & Precautions, NPO status , Patient's Chart, lab work & pertinent test results  History of Anesthesia Complications Negative for: history of anesthetic complications  Airway Mallampati: III   Neck ROM: Full    Dental  (+) Missing   Pulmonary neg pulmonary ROS   Pulmonary exam normal breath sounds clear to auscultation       Cardiovascular hypertension, + Valvular Problems/Murmurs (mild AS)  Rhythm:Regular Rate:Normal + Systolic murmurs ECG 09/02/23:  Sinus bradycardia (HR 43) Otherwise normal ECG  Echo 09/24/22:  NORMAL LEFT VENTRICULAR SYSTOLIC FUNCTION WITH AN ESTIMATED EF = >55 % NORMAL RIGHT VENTRICULAR SYSTOLIC FUNCTION MILD VALVULAR REGURGITATION  MILD AORTIC VALVE STENOSIS MILD RV ENLARGEMENT MILD BIATRIAL ENLARGEMENT GLS: -17.0 %     Neuro/Psych negative neurological ROS     GI/Hepatic ,GERD  ,,  Endo/Other  Obesity   Renal/GU negative Renal ROS     Musculoskeletal  (+) Arthritis ,  Gout    Abdominal   Peds  Hematology negative hematology ROS (+)   Anesthesia Other Findings Cardiology note 09/30/22:  63 y.o. male with  1. Pre-op evaluation  2. Cardiac murmur  3. Hypertension, unspecified type  4. Mixed hyperlipidemia  5. Obesity (BMI 30.0-34.9), unspecified   Plan   Pre-operative clearance for total knee replacement of left knee. Recent echo was reasonably unremarkable. Patient is cleared from a cardiac standpoint for upcoming total knee replacement without restrictions.  Murmur, stable. Recent echo revealed mild aortic stenosis, continue conservative therapy.  Hypertension, well controlled, continue metoprolol and lisinopril.  Hyperlipidemia, recommend low cholesterol diet, consider statins. Recommend calcium score.  Obesity, recommend weight loss, exercise, and portion control Have  patient follow up in 1 year  Return in about 1 year (around 09/30/2023).    Reproductive/Obstetrics                             Anesthesia Physical Anesthesia Plan  ASA: 3  Anesthesia Plan: General   Post-op Pain Management:    Induction: Intravenous  PONV Risk Score and Plan: 2 and Ondansetron, Dexamethasone and Treatment may vary due to age or medical condition  Airway Management Planned: Oral ETT  Additional Equipment:   Intra-op Plan:   Post-operative Plan: Extubation in OR  Informed Consent: I have reviewed the patients History and Physical, chart, labs and discussed the procedure including the risks, benefits and alternatives for the proposed anesthesia with the patient or authorized representative who has indicated his/her understanding and acceptance.     Dental advisory given  Plan Discussed with: CRNA  Anesthesia Plan Comments: (Patient consented for risks of anesthesia including but not limited to:  - adverse reactions to medications - damage to eyes, teeth, lips or other oral mucosa - nerve damage due to positioning  - sore throat or hoarseness - damage to heart, brain, nerves, lungs, other parts of body or loss of life  Informed patient about role of CRNA in peri- and intra-operative care.  Patient voiced understanding.)        Anesthesia Quick Evaluation

## 2023-09-03 NOTE — Discharge Instructions (Signed)
 Discharge Instructions: 1.  Patient may shower, but do not scrub wounds heavily and dab dry only. 2.  Do not submerge wounds in pool/tub until fully healed. 3.  Do not apply ointments or hydrogen peroxide to the wounds. 4.  May apply ice packs to the wounds for comfort. 5.  Please empty and record drain output twice daily and as needed so the bulb only gets half full at the most. 6.  Change drain dressing with dry gauze and tape once daily. 7.  Do not drive while taking narcotics for pain control.  Prior to driving, make sure you are able to rotate right and left to look at blindspots without significant pain or discomfort. 8.  No heavy lifting or pushing of more than 10-15 lbs for 4 weeks.

## 2023-09-03 NOTE — ED Provider Notes (Signed)
 Ultrasound shows gallstones and wall thickening concerning for cholecystitis.  Has gotten multiple rounds of narcotic pain medication with some relief but still pain to the right upper quadrant, still tender to palpation.  I discussed with Dr. Aleen Campi surgery who admit the patient.   Pilar Jarvis, MD 09/03/23 [phone]

## 2023-09-03 NOTE — Anesthesia Procedure Notes (Signed)
 Procedure Name: Intubation Date/Time: 09/03/2023 12:52 PM  Performed by: Lysbeth Penner, CRNAPre-anesthesia Checklist: Patient identified, Emergency Drugs available, Suction available and Patient being monitored Patient Re-evaluated:Patient Re-evaluated prior to induction Oxygen Delivery Method: Circle system utilized Preoxygenation: Pre-oxygenation with 100% oxygen Induction Type: IV induction Ventilation: Mask ventilation without difficulty Laryngoscope Size: McGrath and 4 Tube type: Oral Tube size: 7.0 mm Number of attempts: 1 Airway Equipment and Method: Stylet and Oral airway Placement Confirmation: ETT inserted through vocal cords under direct vision, positive ETCO2 and breath sounds checked- equal and bilateral Secured at: 22 cm Tube secured with: Tape Dental Injury: Teeth and Oropharynx as per pre-operative assessment

## 2023-09-03 NOTE — Op Note (Signed)
 Procedure Date:  09/03/2023  Pre-operative Diagnosis:  Acute cholecystitis  Post-operative Diagnosis: Acute cholecystitis  Procedure:  Robotic assisted cholecystectomy with ICG FireFly cholangiogram  Surgeon:  Howie Ill, MD  Anesthesia:  General endotracheal  Estimated Blood Loss:  50 ml  Specimens:  gallbladder  Complications:  None  Indications for Procedure:  This is a 63 y.o. male who presents with abdominal pain and workup revealing acute cholecystitis.  The benefits, complications, treatment options, and expected outcomes were discussed with the patient. The risks of bleeding, infection, recurrence of symptoms, failure to resolve symptoms, bile duct damage, bile duct leak, retained common bile duct stone, bowel injury, and need for further procedures were all discussed with the patient and he was willing to proceed.  Description of Procedure: The patient was correctly identified in the preoperative area and brought into the operating room.  The patient was placed supine with VTE prophylaxis in place.  Appropriate time-outs were performed.  Anesthesia was induced and the patient was intubated.  Appropriate antibiotics were infused.  The abdomen was prepped and draped in a sterile fashion. An infraumbilical incision was made. A cutdown technique was used to enter the abdominal cavity without injury, and a 12 mm robotic port was inserted.  Pneumoperitoneum was obtained with appropriate opening pressures.  Three 8-mm ports were placed in the mid abdomen at the level of the umbilicus under direct visualization.  The DaVinci platform was docked, camera targeted, and instruments were placed under direct visualization.  The gallbladder was identified.  It was very edematous and distended.  It required laparoscopic needle decompression in order to be able to grasp it better.  After decompression, the fundus was grasped and retracted cephalad.  Adhesions were lysed bluntly and with  electrocautery. The infundibulum was grasped and retracted laterally, exposing the peritoneum overlying the gallbladder.  This was incised with electrocautery and extended on either side of the gallbladder.  FireFly cholangiogram was then obtained.  The cystic duct was obstructed and not visible, but the common bile duct was identified.  After careful dissection, the cystic artery and the cystic duct were identified.  The cystic duct and cystic artery were carefully dissected with combination of cautery and blunt dissection.  Both were clipped twice proximally and once distally, cutting in between.  The gallbladder was taken from the gallbladder fossa in a retrograde fashion with electrocautery. The gallbladder was placed in an Endocatch bag. The liver bed was inspected and any bleeding was controlled with electrocautery. The right upper quadrant was then inspected again revealing intact clips, no bleeding, and no ductal injury.  The area was thoroughly irrigated.  A 19 Fr. Blake drain was inserted via the right lateral port site and placed in the gallbladder bed area.   The 8 mm ports were removed under direct visualization and the 12 mm port was removed.  The Endocatch bag was brought out via the umbilical incision but this required the fascia to be extended to fit the gallbladder. The fascial opening was then closed using multiple 0 vicryl sutures.  Local anesthetic was infused in all incisions and the incisions were closed with 4-0 Monocryl.  The drain was secured using 2-0 Nylon.  The wounds were cleaned and sealed with DermaBond.  The drain was dressed with 4x4 gauze and TegaDerm.  The patient was emerged from anesthesia and extubated and brought to the recovery room for further management.  The patient tolerated the procedure well and all counts were correct at the end  of the case.   Howie Ill, MD

## 2023-09-03 NOTE — Transfer of Care (Signed)
 Immediate Anesthesia Transfer of Care Note  Patient: Anthony Olsen  Procedure(s) Performed: CHOLECYSTECTOMY, ROBOT-ASSISTED, LAPAROSCOPIC  Patient Location: PACU  Anesthesia Type:General  Level of Consciousness: drowsy and responds to stimulation  Airway & Oxygen Therapy: Patient Spontanous Breathing and Patient connected to face mask oxygen  Post-op Assessment: Report given to RN and Post -op Vital signs reviewed and stable  Post vital signs: Reviewed and stable  Last Vitals:  Vitals Value Taken Time  BP 132/69 09/03/23 1545  Temp 36.8 C 09/03/23 1545  Pulse 76 09/03/23 1549  Resp 15 09/03/23 1549  SpO2 100 % 09/03/23 1549  Vitals shown include unfiled device data.  Last Pain:  Vitals:   09/03/23 1211  TempSrc: Oral  PainSc: 0-No pain         Complications: There were no known notable events for this encounter.

## 2023-09-04 ENCOUNTER — Other Ambulatory Visit: Payer: Self-pay

## 2023-09-04 MED ORDER — OXYCODONE-ACETAMINOPHEN 5-325 MG PO TABS: 1.0000 | ORAL_TABLET | Freq: Three times a day (TID) | ORAL | 0 refills | Status: AC | PRN

## 2023-09-04 MED ORDER — DOCUSATE SODIUM 100 MG PO CAPS: 100.0000 mg | ORAL_CAPSULE | Freq: Two times a day (BID) | ORAL | 0 refills | Status: AC | PRN

## 2023-09-04 MED ORDER — AMOXICILLIN-POT CLAVULANATE 875-125 MG PO TABS: 1.0000 | ORAL_TABLET | Freq: Two times a day (BID) | ORAL | 0 refills | Status: AC

## 2023-09-04 NOTE — Progress Notes (Signed)
 Discharge instructions reviewed with the patient. Patient instructed on dressing changed and drain care. IV removed. Patient sent out via wheelchair to belongings

## 2023-09-04 NOTE — Plan of Care (Signed)

## 2023-09-04 NOTE — Plan of Care (Signed)

## 2023-09-04 NOTE — Discharge Summary (Signed)
 Physician Discharge Summary  Patient ID: Anthony Olsen MRN: [id number] DOB/AGE: 02-08-1961 63 y.o.  Admit date: 09/02/2023 Discharge date: 09/04/2023  Admission Diagnoses: Acute cholecystitis  Discharge Diagnoses:  Same as above  Discharged Condition: good  Hospital Course: admitted for above. Underwent surgery.  Please see op note for details.  Post op, recovered as expected.  At time of d/c, tolerating diet and pain controlled.  Discharged with JP drain in place along with p.o. antibiotics  Consults: None  Discharge Exam: Blood pressure 130/74, pulse 66, temperature 97.7 F (36.5 C), temperature source Oral, resp. rate 20, height 6\' 1"  (1.854 m), weight 107.5 kg, SpO2 97%. General appearance: alert, cooperative, and no distress GI: Tenderness to palpation along incision sites as expected.  Incisions are clean dry and intact.  JP with serosanguineous fluid.  Disposition:     Allergies as of 09/04/2023       Reactions   Shellfish Allergy    Aggravates gout        Medication List     STOP taking these medications    apixaban 2.5 MG Tabs tablet Commonly known as: Eliquis   HYDROcodone-acetaminophen 5-325 MG tablet Commonly known as: NORCO/VICODIN   oxyCODONE 5 MG immediate release tablet Commonly known as: Roxicodone       TAKE these medications    acetaminophen 650 MG CR tablet Commonly known as: TYLENOL Take 1,300 mg by mouth every 8 (eight) hours as needed for pain.   amoxicillin-clavulanate 875-125 MG tablet Commonly known as: AUGMENTIN Take 1 tablet by mouth 2 (two) times daily for 5 days.   docusate sodium 100 MG capsule Commonly known as: Colace Take 1 capsule (100 mg total) by mouth 2 (two) times daily as needed for up to 10 days for mild constipation.   hydrochlorothiazide 25 MG tablet Commonly known as: HYDRODIURIL Take 1 tablet by mouth daily.   lisinopril 40 MG tablet Commonly known as: ZESTRIL Take 40 mg by mouth daily with lunch.    metoprolol succinate 50 MG 24 hr tablet Commonly known as: TOPROL-XL Take 50 mg by mouth daily with lunch. Take with or immediately following a meal.   oxyCODONE-acetaminophen 5-325 MG tablet Commonly known as: Percocet Take 1 tablet by mouth every 8 (eight) hours as needed for severe pain (pain score 7-10).        Follow-up Information     Henrene Dodge, MD Follow up on 09/08/2023.   Specialty: General Surgery Why: Appointment scheduled for 09/08/23 at 1:45 PM.  Please arrive 15 min early. Contact information: [address] [place] [address] [phone]                  Total time spent arranging discharge was >71min. Signed: Sung Amabile 09/04/2023, 4:09 AM

## 2023-09-07 LAB — SURGICAL PATHOLOGY

## 2023-09-08 ENCOUNTER — Encounter: Payer: Self-pay | Admitting: Surgery

## 2023-09-08 ENCOUNTER — Ambulatory Visit (INDEPENDENT_AMBULATORY_CARE_PROVIDER_SITE_OTHER): Payer: Self-pay | Admitting: Surgery

## 2023-09-08 VITALS — BP 133/89 | HR 90 | Temp 98.0°F | Ht 73.0 in | Wt 223.0 lb

## 2023-09-08 DIAGNOSIS — Z09 Encounter for follow-up examination after completed treatment for conditions other than malignant neoplasm: Secondary | ICD-10-CM

## 2023-09-08 DIAGNOSIS — K810 Acute cholecystitis: Secondary | ICD-10-CM

## 2023-09-08 NOTE — Progress Notes (Signed)
 09/08/2023  HPI: Anthony Olsen is a 63 y.o. male s/p robotic assisted cholecystectomy on 09/03/2023 for acute cholecystitis.  Due to significant inflammatory changes, a Blake drain was left in place.  Patient presents today for follow-up.  He continues taking antibiotics as instructed and is doing well from the abdominal standpoint.  Reports some diarrhea but otherwise denies any worsening pain or crampiness.  Drain has been decreasing in output over the last few days.  Pain is improving as well and his mobility is improving as well.  Vital signs: BP 133/89   Pulse 90   Temp 98 F (36.7 C)   Ht 6\' 1"  (1.854 m)   Wt 223 lb (101.2 kg)   SpO2 99%   BMI 29.42 kg/m    Physical Exam: Constitutional: No acute distress Abdomen: Soft, nondistended, appropriately sore to palpation.  Incisions are clean, dry, intact with Dermabond in place.  Mild surrounding ecchymosis.  Right-sided drain with serosanguineous fluid.  Drain was removed at bedside without complications.  Dry gauze dressing applied.  Assessment/Plan: This is a 63 y.o. male s/p robotic assisted cholecystectomy.  - Patient recovering appropriately from his surgery last week.  Drain was removed at bedside without complications.  Dressing instructions given. - Continue to biotics until course completed. 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  Work note will be given today. - Follow-up in 2 weeks.  Patient understands that he could cancel the appointment if he is feeling well.   Howie Ill, MD  [facility]

## 2023-09-08 NOTE — Patient Instructions (Addendum)
 Keep a dressing over your drain site until tomorrow. Change this daily until it stops draining, this should take 3-4 days.   Follow up here in 2 weeks.  GENERAL POST-OPERATIVE PATIENT INSTRUCTIONS   WOUND CARE INSTRUCTIONS:  Keep a dry clean dressing on the wound if there is drainage. The initial bandage may be removed after 24 hours.  Once the wound has quit draining you may leave it open to air.  If clothing rubs against the wound or causes irritation and the wound is not draining you may cover it with a dry dressing during the daytime.  Try to keep the wound dry and avoid ointments on the wound unless directed to do so.  If the wound becomes bright red and painful or starts to drain infected material that is not clear, please contact your physician immediately.  If the wound is mildly pink and has a thick firm ridge underneath it, this is normal, and is referred to as a healing ridge.  This will resolve over the next 4-6 weeks.  BATHING: You may shower if you have been informed of this by your surgeon. However, Please do not submerge in a tub, hot tub, or pool until incisions are completely sealed or have been told by your surgeon that you may do so.  DIET:  You may eat any foods that you can tolerate.  It is a good idea to eat a high fiber diet and take in plenty of fluids to prevent constipation.  If you do become constipated you may want to take a mild laxative or take ducolax tablets on a daily basis until your bowel habits are regular.  Constipation can be very uncomfortable, along with straining, after recent surgery.  ACTIVITY:  You are encouraged to cough and deep breath or use your incentive spirometer if you were given one, every 15-30 minutes when awake.  This will help prevent respiratory complications and low grade fevers post-operatively if you had a general anesthetic.  You may want to hug a pillow when coughing and sneezing to add additional support to the surgical area, if you had  abdominal or chest surgery, which will decrease pain during these times.  You are encouraged to walk and engage in light activity for the next two weeks.  You should not lift more than 20 pounds for 6 weeks total after surgery as it could put you at increased risk for complications.  Twenty pounds is roughly equivalent to a plastic bag of groceries. At that time- Listen to your body when lifting, if you have pain when lifting, stop and then try again in a few days. Soreness after doing exercises or activities of daily living is normal as you get back in to your normal routine.  MEDICATIONS:  Try to take narcotic medications and anti-inflammatory medications, such as tylenol, ibuprofen, naprosyn, etc., with food.  This will minimize stomach upset from the medication.  Should you develop nausea and vomiting from the pain medication, or develop a rash, please discontinue the medication and contact your physician.  You should not drive, make important decisions, or operate machinery when taking narcotic pain medication.  SUNBLOCK Use sun block to incision area over the next year if this area will be exposed to sun. This helps decrease scarring and will allow you avoid a permanent darkened area over your incision.  QUESTIONS:  Please feel free to call our office if you have any questions, and we will be glad to assist you. [phone]

## 2023-09-10 NOTE — Anesthesia Postprocedure Evaluation (Signed)
 Anesthesia Post Note  Patient: Anthony Olsen  Procedure(s) Performed: CHOLECYSTECTOMY, ROBOT-ASSISTED, LAPAROSCOPIC  Patient location during evaluation: PACU Anesthesia Type: General Level of consciousness: awake and alert Pain management: pain level controlled Vital Signs Assessment: post-procedure vital signs reviewed and stable Respiratory status: spontaneous breathing, nonlabored ventilation, respiratory function stable and patient connected to nasal cannula oxygen Cardiovascular status: blood pressure returned to baseline and stable Postop Assessment: no apparent nausea or vomiting Anesthetic complications: no   There were no known notable events for this encounter.   Last Vitals:  Vitals:   09/04/23 0351 09/04/23 0758  BP: 130/74 134/78  Pulse: 66 66  Resp: 20 18  Temp: 36.5 C 36.6 C  SpO2: 97% 97%    Last Pain:  Vitals:   09/04/23 1117  TempSrc:   PainSc: 3                  Lenard Simmer

## 2023-09-22 ENCOUNTER — Encounter: Admitting: Surgery
